# Patient Record
Sex: Male | Born: 1980 | Race: Black or African American | Hispanic: No | Marital: Single | Smoking: Current every day smoker
Health system: Southern US, Community
[De-identification: ages and names within clinical notes are randomized; demographics above are authoritative.]

## PROBLEM LIST (undated history)

## (undated) ENCOUNTER — Ambulatory Visit (HOSPITAL_COMMUNITY): Admission: EM | Payer: Self-pay | Source: Home / Self Care

## (undated) DIAGNOSIS — N200 Calculus of kidney: Secondary | ICD-10-CM

## (undated) DIAGNOSIS — F10121 Alcohol abuse with intoxication delirium: Secondary | ICD-10-CM

## (undated) HISTORY — PX: HERNIA REPAIR: SHX51

---

## 2005-06-08 ENCOUNTER — Emergency Department (HOSPITAL_COMMUNITY): Admission: EM | Admit: 2005-06-08 | Discharge: 2005-06-08 | Payer: Self-pay | Admitting: Emergency Medicine

## 2007-03-08 ENCOUNTER — Emergency Department (HOSPITAL_COMMUNITY): Admission: EM | Admit: 2007-03-08 | Discharge: 2007-03-08 | Payer: Self-pay | Admitting: Emergency Medicine

## 2009-06-18 ENCOUNTER — Emergency Department (HOSPITAL_COMMUNITY): Admission: EM | Admit: 2009-06-18 | Discharge: 2009-06-18 | Payer: Self-pay | Admitting: Emergency Medicine

## 2009-06-29 ENCOUNTER — Emergency Department (HOSPITAL_COMMUNITY): Admission: EM | Admit: 2009-06-29 | Discharge: 2009-06-29 | Payer: Self-pay | Admitting: Emergency Medicine

## 2011-03-10 ENCOUNTER — Emergency Department (HOSPITAL_COMMUNITY)
Admission: EM | Admit: 2011-03-10 | Discharge: 2011-03-10 | Disposition: A | Discharge status: Home / Self Care | Payer: Self-pay | Attending: Emergency Medicine | Admitting: Emergency Medicine

## 2011-03-10 DIAGNOSIS — J039 Acute tonsillitis, unspecified: Secondary | ICD-10-CM | POA: Insufficient documentation

## 2011-03-10 LAB — RAPID STREP SCREEN (MED CTR MEBANE ONLY): Streptococcus, Group A Screen (Direct): NEGATIVE

## 2011-04-26 ENCOUNTER — Emergency Department (HOSPITAL_COMMUNITY)
Admission: EM | Admit: 2011-04-26 | Discharge: 2011-04-26 | Disposition: A | Discharge status: Home / Self Care | Payer: Self-pay | Attending: Emergency Medicine | Admitting: Emergency Medicine

## 2013-05-27 ENCOUNTER — Emergency Department (HOSPITAL_COMMUNITY)
Admission: EM | Admit: 2013-05-27 | Discharge: 2013-05-27 | Disposition: A | Discharge status: Home / Self Care | Payer: Self-pay | Attending: Emergency Medicine | Admitting: Emergency Medicine

## 2013-05-27 ENCOUNTER — Encounter (HOSPITAL_COMMUNITY): Payer: Self-pay | Admitting: Emergency Medicine

## 2013-05-27 ENCOUNTER — Emergency Department (HOSPITAL_COMMUNITY): Payer: Self-pay

## 2013-05-27 DIAGNOSIS — M25512 Pain in left shoulder: Secondary | ICD-10-CM

## 2013-05-27 DIAGNOSIS — M25519 Pain in unspecified shoulder: Secondary | ICD-10-CM | POA: Insufficient documentation

## 2013-05-27 DIAGNOSIS — F17200 Nicotine dependence, unspecified, uncomplicated: Secondary | ICD-10-CM | POA: Insufficient documentation

## 2013-05-27 MED ORDER — TRAMADOL HCL 50 MG PO TABS: 50.0000 mg | ORAL_TABLET | Freq: Four times a day (QID) | ORAL | Status: DC | PRN

## 2013-05-27 NOTE — ED Provider Notes (Signed)
CSN: [id number]     Arrival date & time 05/27/13  0809 History  This chart was scribed for non-physician practitioner Roger Sites, PA-C, working with Bonnita Levan. Bernette Mayers, MD by Dorothey Baseman, ED Scribe. This patient was seen in room TR06C/TR06C and the patient's care was started at 9:23 AM.    Chief Complaint  Patient presents with  . Shoulder Pain   The history is provided by the patient. No language interpreter was used.   HPI Comments: Roger Wright is a 32 y.o. male who presents to the Emergency Department complaining of an intermittent, aching, pain to the left shoulder pain secondary to a fall that occurred 1 week ago. No head trauma or LOC.  He reports that the pain is exacerbated with backwards movement of the left shoulder, no pain with movement in other directions. Patient reports associated mild swelling to the area and a decreased range of motion. He denies history of prior injury to the area.  Denies any numbness or paresthesias of extremity.  History reviewed. No pertinent past medical history. History reviewed. No pertinent past surgical history. No family history on file. History  Substance Use Topics  . Smoking status: Current Every Day Smoker  . Smokeless tobacco: Not on file  . Alcohol Use: Yes    Review of Systems  Musculoskeletal: Positive for arthralgias.  All other systems reviewed and are negative.    Allergies  Review of patient's allergies indicates no known allergies.  Home Medications   Current Outpatient Rx  Name  Route  Sig  Dispense  Refill  . HYDROcodone-acetaminophen (NORCO/VICODIN) 5-325 MG per tablet   Oral   Take 1 tablet by mouth every 6 (six) hours as needed for pain.         . hydrocortisone cream 1 %   Topical   Apply 1 application topically daily as needed (Nose red spot).         Marland Kitchen ibuprofen (ADVIL,MOTRIN) 200 MG tablet   Oral   Take 800 mg by mouth every 6 (six) hours as needed for pain.          Triage Vitals: BP 155/88   Pulse 61  Temp(Src) 98.1 F (36.7 C) (Oral)  Resp 20  Ht 5\' 9"  (1.753 m)  Wt 175 lb (79.379 kg)  BMI 25.83 kg/m2  SpO2 99%  Physical Exam  Nursing note and vitals reviewed. Constitutional: He is oriented to person, place, and time. He appears well-developed and well-nourished.  HENT:  Head: Normocephalic and atraumatic.  Eyes: Conjunctivae and EOM are normal.  Neck: Normal range of motion.  Cardiovascular: Normal rate, regular rhythm and normal heart sounds.   Pulmonary/Chest: Effort normal and breath sounds normal. No respiratory distress.  Abdominal: Soft. He exhibits no distension.  Musculoskeletal: Normal range of motion.       Left shoulder: He exhibits tenderness, bony tenderness, swelling and pain. He exhibits normal range of motion, no effusion, no crepitus, no deformity, no laceration, no spasm, normal pulse and normal strength.  Tenderness to palpation to the left [facility] joint with mild swelling; full ROM maintained, some pain with hyperextension of the left shoulder; strong distal pulse and cap refill; sensation intact  Neurological: He is alert and oriented to person, place, and time.  Skin: Skin is warm and dry.  Psychiatric: He has a normal mood and affect.    ED Course  Procedures (including critical care time)  DIAGNOSTIC STUDIES: Oxygen Saturation is 99% on room air, normal by my interpretation.  COORDINATION OF CARE: 9:24AM- Will order an x-ray of the left shoulder. Discussed treatment plan with patient at bedside and patient verbalized agreement.   10:25 AM- Discussed negative x-ray results. Advised patient to avoid strenuous activity until symptoms subside. Will manage pain symptoms with medications. Discussed treatment plan with patient at bedside and patient verbalized agreement.   Labs Review Labs Reviewed - No data to display  Imaging Review Dg Shoulder Left  05/27/2013   CLINICAL DATA:  Pain in the left shoulder.  EXAM: LEFT SHOULDER - 2+ VIEW   COMPARISON:  No priors.  FINDINGS: Multiple views of the left shoulder demonstrate no acute displaced fracture, subluxation, dislocation, or soft tissue abnormality.  IMPRESSION: No acute radiographic abnormality of the left shoulder.   Electronically Signed   By: Trudie Reed M.D.   On: 05/27/2013 10:12    EKG Interpretation   None       MDM   1. Shoulder pain, left    X-ray negative for acute fracture or dislocation.  Patient will be discharged with tramadol. Instructed to limited strenuous shoulder activity, including weight lifting, until sx improve.  [facility]. Discussed plan with patient, he agreed. Return precautions advised.  I personally performed the services described in this documentation, which was scribed in my presence. The recorded information has been reviewed and is accurate.  Garlon Hatchet, PA-C 05/27/13 1044

## 2013-05-27 NOTE — ED Notes (Signed)
Pt. Fell 1 week ago straight back and since has been having pain to the top of his lt. shoulder area.  Mild swelling noted.  Pt. Does have full ROM.   Pain is intermittent.

## 2013-05-29 NOTE — ED Provider Notes (Signed)
Medical screening examination/treatment/procedure(s) were performed by non-physician practitioner and as supervising physician I was immediately available for consultation/collaboration.   Charles B. Sheldon, MD 05/29/13 1224 

## 2013-12-26 ENCOUNTER — Emergency Department (HOSPITAL_COMMUNITY): Payer: Self-pay

## 2013-12-26 ENCOUNTER — Encounter (HOSPITAL_COMMUNITY): Payer: Self-pay | Admitting: Emergency Medicine

## 2013-12-26 ENCOUNTER — Emergency Department (HOSPITAL_COMMUNITY)
Admission: EM | Admit: 2013-12-26 | Discharge: 2013-12-26 | Disposition: A | Discharge status: Home / Self Care | Payer: Self-pay | Attending: Emergency Medicine | Admitting: Emergency Medicine

## 2013-12-26 DIAGNOSIS — K6289 Other specified diseases of anus and rectum: Secondary | ICD-10-CM | POA: Insufficient documentation

## 2013-12-26 DIAGNOSIS — F17200 Nicotine dependence, unspecified, uncomplicated: Secondary | ICD-10-CM | POA: Insufficient documentation

## 2013-12-26 DIAGNOSIS — IMO0002 Reserved for concepts with insufficient information to code with codable children: Secondary | ICD-10-CM | POA: Insufficient documentation

## 2013-12-26 LAB — CBC WITH DIFFERENTIAL/PLATELET
Basophils Absolute: 0 10*3/uL (ref 0.0–0.1)
Basophils Relative: 1 % (ref 0–1)
EOS PCT: 5 % (ref 0–5)
Eosinophils Absolute: 0.3 10*3/uL (ref 0.0–0.7)
HCT: 40 % (ref 39.0–52.0)
Hemoglobin: 13.8 g/dL (ref 13.0–17.0)
LYMPHS PCT: 27 % (ref 12–46)
Lymphs Abs: 1.8 10*3/uL (ref 0.7–4.0)
MCH: 30 pg (ref 26.0–34.0)
MCHC: 34.5 g/dL (ref 30.0–36.0)
MCV: 87 fL (ref 78.0–100.0)
Monocytes Absolute: 0.6 10*3/uL (ref 0.1–1.0)
Monocytes Relative: 9 % (ref 3–12)
NEUTROS PCT: 59 % (ref 43–77)
Neutro Abs: 3.8 10*3/uL (ref 1.7–7.7)
PLATELETS: 208 10*3/uL (ref 150–400)
RBC: 4.6 MIL/uL (ref 4.22–5.81)
RDW: 13.4 % (ref 11.5–15.5)
WBC: 6.4 10*3/uL (ref 4.0–10.5)

## 2013-12-26 LAB — BASIC METABOLIC PANEL
BUN: 7 mg/dL (ref 6–23)
CO2: 26 mEq/L (ref 19–32)
CREATININE: 1.21 mg/dL (ref 0.50–1.35)
Calcium: 9.2 mg/dL (ref 8.4–10.5)
Chloride: 101 mEq/L (ref 96–112)
GFR, EST NON AFRICAN AMERICAN: 78 mL/min — AB (ref >90)
GLUCOSE: 109 mg/dL — AB (ref 70–99)
Potassium: 3.8 mEq/L (ref 3.7–5.3)
Sodium: 139 mEq/L (ref 137–147)

## 2013-12-26 MED ORDER — LIDOCAINE HCL 1 % IJ SOLN
INTRAMUSCULAR | Status: AC
  Administered 2013-12-26: 1.2 mL
  Filled 2013-12-26: qty 20

## 2013-12-26 MED ORDER — CEFTRIAXONE SODIUM 250 MG IJ SOLR
250.0000 mg | Freq: Once | INTRAMUSCULAR | Status: AC
  Administered 2013-12-26: 250 mg via INTRAMUSCULAR
  Filled 2013-12-26: qty 250

## 2013-12-26 MED ORDER — DOXYCYCLINE HYCLATE 100 MG PO CAPS: 100.0000 mg | ORAL_CAPSULE | Freq: Two times a day (BID) | ORAL | Status: DC

## 2013-12-26 NOTE — Discharge Instructions (Signed)
Proctitis °Proctitis is the swelling and soreness (inflammation) of the lining of the rectum. The rectum is at the end of the large intestine and is attached to the anus. The inflammation causes pain and discomfort. It may be short-term (acute) or long-lasting (chronic). °CAUSES °Inflammation in the rectum can be caused by many things, such as: °· Sexually transmitted diseases (STDs). °· Infection. °· Anal-rectal trauma or injury. °· Ulcerative colitis or Crohn's disease. °· Radiation therapy directed near the rectum. °· Antibiotic therapy. °SYMPTOMS °· Sudden, uncomfortable, and frequent urge to have a bowel movement. °· Anal or rectal pain. °· Abdominal cramping or pain. °· Sensation that the rectum is full. °· Rectal bleeding. °· Pus or mucus discharge from anus. °· Diarrhea or frequent soft, loose stools. °DIAGNOSIS °Diagnosis may include the following: °· A history and physical exam. °· An STD test. °· Blood tests. °· Stool tests. °· Rectal culture. °· A procedure to evaluate the anal canal (anoscopy). °· Procedures to look at part, or the entire large bowel (sigmoidoscopy, colonoscopy). °TREATMENT °Treatment of proctitis depends on the cause. Reducing the symptoms of inflammation and eliminating infection are the main goals of treatment. Treatment may include: °· Home remedies and lifestyle, such as sitz baths and avoiding food right before bedtime. °· Topical ointments, foams, suppositories, or enemas, such as corticosteroids or anti-inflammatories. °· Antibiotic or antiviral medicines to treat infection or to control harmful bacteria. °· Medicines to control diarrhea, soften stools, and reduce pain. °· Medicines to suppress the immune system. °· Avoiding the activity that caused rectal trauma. °· Nutritional, dietary, or herbal supplements. °· Heat or laser therapy for persistent bleeding. °· A dilation procedure to enlarge a narrowed rectum. °· Surgery, though rare, may be necessary to repair damaged rectal  lining. °HOME CARE INSTRUCTIONS °Only take medicines that are recommended or approved by your caregiver. Do not take anti-diarrhea medicine without your caregiver's approval. °SEEK MEDICAL CARE IF: °· You often experience one or more of the symptoms noted above. °· You keep experiencing symptoms after treatment. °· You have questions or concerns about your symptoms or treatment plan. °MAKE SURE YOU: °· Understand these instructions. °· Will watch your condition. °· Will get help right away if you are not doing well or get worse. °FOR MORE INFORMATION °National Institute of Diabetes and Digestive and Kidney Disease (NIDDK): www.digestive.niddk.hih.gov/ °Document Released: 07/22/2011 Document Revised: 11/27/2012 Document Reviewed: 07/22/2011 °ExitCare® Patient Information ©2014 [employer]. ° °

## 2013-12-26 NOTE — ED Notes (Signed)
Pt states he's constipated, states last normal BM was Sunday, states since then has had small BM's but does not equal to amount of food he's eaten, states has taken laxatives and drank water w/ no relief, states having lower back pain from constipation. Denies urinary symptoms.

## 2013-12-26 NOTE — ED Provider Notes (Signed)
CSN: [id number]     Arrival date & time 12/26/13  2005 History   First MD Initiated Contact with Patient 12/26/13 2029     Chief Complaint  Patient presents with  . Constipation    HPI Comments: Pt has the sensation he has to have a bowel movement but just is having a paste discharge.  Patient is a 33 y.o. male presenting with constipation.  Constipation Severity:  Moderate Time since last bowel movement:  3 days Timing:  Constant Progression:  Worsening Chronicity:  New Context: not dehydration and not dietary changes   Stool description:  Small Relieved by:  Nothing Ineffective treatments:  Laxatives Associated symptoms: no abdominal pain, no anorexia, no fever, no nausea and no vomiting   Pt states he has had anal intercourse.  None in months.  History reviewed. No pertinent past medical history. Past Surgical History  Procedure Laterality Date  . Hernia repair     No family history on file. History  Substance Use Topics  . Smoking status: Current Every Day Smoker  . Smokeless tobacco: Never Used  . Alcohol Use: Yes    Review of Systems  Constitutional: Negative for fever.  Gastrointestinal: Positive for constipation. Negative for nausea, vomiting, abdominal pain and anorexia.  All other systems reviewed and are negative.     Allergies  Review of patient's allergies indicates no known allergies.  Home Medications   Prior to Admission medications   Medication Sig Start Date End Date Taking? Authorizing Provider  hydrocortisone cream 1 % Apply 1 application topically daily as needed (Nose red spot).    Historical Provider, MD  ibuprofen (ADVIL,MOTRIN) 200 MG tablet Take 800 mg by mouth every 6 (six) hours as needed for pain.    Historical Provider, MD   BP 160/85  Pulse 76  Temp(Src) 99.8 F (37.7 C) (Oral)  Resp 20  SpO2 99% Physical Exam  Nursing note and vitals reviewed. Constitutional: He appears well-developed and well-nourished. No distress.   HENT:  Head: Normocephalic and atraumatic.  Right Ear: External ear normal.  Left Ear: External ear normal.  Eyes: Conjunctivae are normal. Right eye exhibits no discharge. Left eye exhibits no discharge. No scleral icterus.  Neck: Neck supple. No tracheal deviation present.  Cardiovascular: Normal rate, regular rhythm and intact distal pulses.   Pulmonary/Chest: Effort normal and breath sounds normal. No stridor. No respiratory distress. He has no wheezes. He has no rales.  Abdominal: Soft. Bowel sounds are normal. He exhibits no distension. There is no tenderness. There is no rebound and no guarding.  Genitourinary: Rectum normal.  No fecal impaction, brown stool  Musculoskeletal: He exhibits no edema and no tenderness.  Neurological: He is alert. He has normal strength. No cranial nerve deficit (no facial droop, extraocular movements intact, no slurred speech) or sensory deficit. He exhibits normal muscle tone. He displays no seizure activity. Coordination normal.  Skin: Skin is warm and dry. No rash noted.  Psychiatric: He has a normal mood and affect.    ED Course  Procedures (including critical care time) Labs Review Labs Reviewed  BASIC METABOLIC PANEL - Abnormal; Notable for the following:    Glucose, Bld 109 (*)    GFR calc non Af Amer 78 (*)    All other components within normal limits  CBC WITH DIFFERENTIAL    Imaging Review Dg Abd Acute W/chest  12/26/2013   CLINICAL DATA:  Constipation abdominal pain  EXAM: ACUTE ABDOMEN SERIES (ABDOMEN 2 VIEW & CHEST 1  VIEW)  COMPARISON:  None.  FINDINGS: There is no evidence of dilated bowel loops or free intraperitoneal air. No radiopaque calculi or other significant radiographic abnormality is seen. Heart size and mediastinal contours are within normal limits. Both lungs are clear.  IMPRESSION: Negative abdominal radiographs.  No acute cardiopulmonary disease.   Electronically Signed   By: Alcide CleverMark  Lukens M.D.   On: 12/26/2013 21:15      MDM   Final diagnoses:  Proctitis   Labs and xray are normal.  Symptoms are suggestive of a proctitis.  He denies recent intercourse but will try a course of abx.  Follow up with PCP  Celene KrasJon R Riniyah Speich, MD 12/27/13 [phone]

## 2013-12-26 NOTE — ED Notes (Signed)
MD at bedside. 

## 2014-06-11 ENCOUNTER — Emergency Department (HOSPITAL_COMMUNITY)
Admission: EM | Admit: 2014-06-11 | Discharge: 2014-06-11 | Disposition: A | Discharge status: Home / Self Care | Payer: Self-pay | Attending: Emergency Medicine | Admitting: Emergency Medicine

## 2014-06-11 ENCOUNTER — Encounter (HOSPITAL_COMMUNITY): Payer: Self-pay | Admitting: Emergency Medicine

## 2014-06-11 DIAGNOSIS — J029 Acute pharyngitis, unspecified: Secondary | ICD-10-CM | POA: Insufficient documentation

## 2014-06-11 DIAGNOSIS — Z792 Long term (current) use of antibiotics: Secondary | ICD-10-CM | POA: Insufficient documentation

## 2014-06-11 DIAGNOSIS — Z720 Tobacco use: Secondary | ICD-10-CM | POA: Insufficient documentation

## 2014-06-11 LAB — RAPID STREP SCREEN (MED CTR MEBANE ONLY): Streptococcus, Group A Screen (Direct): NEGATIVE

## 2014-06-11 MED ORDER — KETOROLAC TROMETHAMINE 60 MG/2ML IM SOLN
60.0000 mg | Freq: Once | INTRAMUSCULAR | Status: AC
  Administered 2014-06-11: 60 mg via INTRAMUSCULAR
  Filled 2014-06-11: qty 2

## 2014-06-11 MED ORDER — DEXAMETHASONE SODIUM PHOSPHATE 10 MG/ML IJ SOLN
10.0000 mg | Freq: Once | INTRAMUSCULAR | Status: AC
  Administered 2014-06-11: 10 mg via INTRAMUSCULAR
  Filled 2014-06-11: qty 1

## 2014-06-11 MED ORDER — HYDROCODONE-ACETAMINOPHEN 5-325 MG PO TABS: 1.0000 | ORAL_TABLET | ORAL | Status: DC | PRN

## 2014-06-11 NOTE — ED Notes (Signed)
Pt reporting sore throat x2 days. Was around cousin this past weekend who was diagnosed with strep throat. Denies N, V, D, F. C/o pain on left side of face radiating across cheek. Assumed it was dental related. Took Goody powder and Tylenol today with no alleviation of symptoms. No other questions/concerns.

## 2014-06-11 NOTE — Discharge Instructions (Signed)
Read instructions below to learn more about your diagnosis and for reasons to return to the ED. Followup with your doctor if symptoms are not improving after 3-4 days.  If you do not have a doctor use the resource guide listed below to help he find one. You may return to the emergency department if symptoms worsen, become progressive, or become more concerning. Use Naphazoline 1 drop every 2 hours as needed for itching. Do NOT use this medication more then 5 days!!  Viral Pharyngitis  Viral pharyngitis is a viral infection that produces redness, pain, and swelling (inflammation) of the throat. Viral infections are not treated with antibiotics.  HOME CARE INSTRUCTIONS  Drink enough fluids to keep your urine clear or pale yellow.  Eat soft, cold foods such as ice cream, frozen ice pops, or gelatin dessert.  Gargle with warm salt water (1 tsp salt per 1 qt of water).  Throat lozenges  Use over the counter medications for symptomatic relief  (Tylenol for fever/pain, Motrin/Ibuprofen for swelling/pain)  To help prevent spreading viral pharyngitis to others, avoid:  Mouth-to-mouth contact with others.  Sharing utensils for eating and drinking.  Coughing around others.   SEEK IMMEDIATE MEDICAL CARE IF:  A rash develops.  Bloody substance is coughed up.  You are unable to swallow liquids or food for 24 hours.  You develop any new symptoms such as uncontrolable vomiting, severe headache, stiff neck, chest pain, or trouble breathing or swallowing.  You have severe throat pain along with drooling or voice changes.   You are unable to fully open the mouth.   RESOURCE GUIDE  Dental Problems  Patients with Medicaid: [facility]                     [facility] [phone]                                           [phone] Phone:  [phone]                                                  Phone:  [phone]  If unable to pay or uninsured, contact:  [facility]  or [facility] to become qualified for the adult dental clinic.  Chronic Pain Problems Contact [facility]  [phone] Patients need to be referred by their primary care doctor.  Insufficient Money for Medicine Contact [employer]:  call "211" or [facility] [phone].  No Primary Care Doctor Call [facility]  [phone] Other agencies that provide inexpensive medical care    [facility]  [phone]    [facility]  [phone]    [facility]  [phone]    [facility]  [phone]    [facility]  [phone]    [facility]  [phone]  Psychological Services [facility]  [phone] [facility]  [phone] [facility]   [phone] (emergency services [phone])  Substance Abuse Resources [facility]  [phone] [facility] [phone] [facility] [phone] [facility] [phone] Residential & Outpatient Substance Abuse Program  [phone]  Abuse/Neglect [facility]  Hotline 3073771946(336) [phone])[phone] [facility] Hotline (865) [phone] (After Hours)  Emergency Shelter [facility] [phone]  M[facility] [place] [place] [phone]) [phone] [facility] [phone]  MRSA Hotline #:   8653879631(316)088-4456    [facility]  [facility]     [employer]                          [facility] [address] [place]                       [address]      [address]  [place]                                                [place]                            [place] Phone:  [phone]                                   Phone:  [phone]                 Phone:  [phone]  [facility] Phone:  [phone]  [facility] Child Abuse Hotline 209-334-0941(336) (914)786-4530 [phone])[phone](336)  [phone] (After Hours)

## 2014-06-11 NOTE — ED Provider Notes (Signed)
CSN: [id number]     Arrival date & time 06/11/14  1739 History  This chart was scribed for non-physician practitioner Arthor CaptainAbigail Treina Arscott, PA-C working with Arby BarretteMarcy Pfeiffer, MD by Littie Deedsichard Sun, ED Scribe. This patient was seen in room WTR7/WTR7 and the patient's care was started at 7:46 PM.       Chief Complaint  Patient presents with  . Sore Throat     The history is provided by the patient. No language interpreter was used.   HPI Comments: Roger Wright is a 33 y.o. male who presents to the Emergency Department complaining of gradual onset, gradually worsening, constant sore throat that began 2 days ago. Patient states that the pain is mostly in the left side of his throat. He states the pain is worse during the day after work. He has not tried anything for his symptoms. Patient denies cough, fever, chills, congestion, SOB. 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 Patient is a smoker. He is not aware of any dental problems.   History reviewed. No pertinent past medical history. Past Surgical History  Procedure Laterality Date  . Hernia repair     History reviewed. No pertinent family history. History  Substance Use Topics  . Smoking status: Current Every Day Smoker  . Smokeless tobacco: Never Used  . Alcohol Use: Yes    Review of Systems  Constitutional: Negative for fever and chills.  HENT: Positive for sore throat. Negative for congestion.   Respiratory: Negative for cough and shortness of breath.   Gastrointestinal: Negative for nausea, vomiting and diarrhea.      Allergies  Review of patient's allergies indicates no known allergies.  Home Medications   Prior to Admission medications   Medication Sig Start Date End Date Taking? Authorizing Provider  bismuth subsalicylate (PEPTO BISMOL) 262 MG/15ML suspension Take 30 mLs by mouth as needed for indigestion.    Historical Provider, MD  docusate sodium (COLACE) 100 MG capsule Take 300-400 mg by mouth 3 (three)  times daily as needed for moderate constipation.    Historical Provider, MD  doxycycline (VIBRAMYCIN) 100 MG capsule Take 1 capsule (100 mg total) by mouth 2 (two) times daily. 12/26/13   Linwood DibblesJon Knapp, MD  HYDROcodone-acetaminophen (NORCO) 5-325 MG per tablet Take 1 tablet by mouth every 4 (four) hours as needed. 06/11/14   Arthor CaptainAbigail Sabria Florido, PA-C  ibuprofen (ADVIL,MOTRIN) 200 MG tablet Take 400-600 mg by mouth every 6 (six) hours as needed for pain.     Historical Provider, MD   BP 144/93 mmHg  Pulse 64  Temp(Src) 99 F (37.2 C) (Oral)  Resp 16  SpO2 100% Physical Exam  Nursing note and vitals reviewed. Constitutional: He is oriented to person, place, and time. He appears well-developed and well-nourished. No distress.  HENT:  Head: Normocephalic and atraumatic.  Mouth/Throat: Oropharynx is clear and moist. No oropharyngeal exudate.  Uvula deviated to left. Swelling of uvula. Throat erythematous. No exudates. Tonsils look normal.  Eyes: Pupils are equal, round, and reactive to light.  Neck: Neck supple.  Cardiovascular: Normal rate.   Pulmonary/Chest: Effort normal.  Musculoskeletal: He exhibits no edema.  Neurological: He is alert and oriented to person, place, and time. No cranial nerve deficit.  Skin: Skin is warm and dry. No rash noted.  Psychiatric: He has a normal mood and affect. His behavior is normal.    ED Course  Procedures  DIAGNOSTIC STUDIES: Oxygen Saturation is 100% on room air, normal by my interpretation.  COORDINATION OF CARE: 7:46 PM-Discussed treatment plan which includes medication with pt at bedside and pt agreed to plan.    Labs Review Labs Reviewed  RAPID STREP SCREEN  CULTURE, GROUP A STREP    Imaging Review No results found.   EKG Interpretation None      MDM   Final diagnoses:  Pharyngitis   Pt afebrile without tonsillar exudate, negative strep. Presents with mild cervical lymphadenopathy, & dysphagia; diagnosis of viral pharyngitis. No  abx indicated. DC w symptomatic tx for pain  Pt does not appear dehydrated, but did discuss importance of water rehydration. Presentation non concerning for PTA or infxn spread to soft tissue. No trismus or uvula deviation. Specific return precautions discussed. Pt able to drink water in ED without difficulty with intact air way. Recommended PCP follow up.   I personally performed the services described in this documentation, which was scribed in my presence. The recorded information has been reviewed and is accurate.    Arthor CaptainAbigail Karstyn Birkey, PA-C 06/16/14 1955

## 2014-06-13 LAB — CULTURE, GROUP A STREP

## 2014-12-30 ENCOUNTER — Emergency Department (HOSPITAL_COMMUNITY): Payer: Self-pay

## 2014-12-30 ENCOUNTER — Encounter (HOSPITAL_COMMUNITY): Payer: Self-pay | Admitting: Emergency Medicine

## 2014-12-30 ENCOUNTER — Inpatient Hospital Stay (HOSPITAL_COMMUNITY)
Admission: EM | Admit: 2014-12-30 | Discharge: 2014-12-31 | DRG: 443 | Disposition: A | Discharge status: Home / Self Care | Payer: Self-pay | Attending: Internal Medicine | Admitting: Internal Medicine

## 2014-12-30 DIAGNOSIS — F17200 Nicotine dependence, unspecified, uncomplicated: Secondary | ICD-10-CM

## 2014-12-30 DIAGNOSIS — Y908 Blood alcohol level of 240 mg/100 ml or more: Secondary | ICD-10-CM | POA: Diagnosis present

## 2014-12-30 DIAGNOSIS — Z79899 Other long term (current) drug therapy: Secondary | ICD-10-CM

## 2014-12-30 DIAGNOSIS — R9431 Abnormal electrocardiogram [ECG] [EKG]: Secondary | ICD-10-CM

## 2014-12-30 DIAGNOSIS — B169 Acute hepatitis B without delta-agent and without hepatic coma: Principal | ICD-10-CM | POA: Diagnosis present

## 2014-12-30 DIAGNOSIS — X58XXXA Exposure to other specified factors, initial encounter: Secondary | ICD-10-CM

## 2014-12-30 DIAGNOSIS — R7401 Elevation of levels of liver transaminase levels: Secondary | ICD-10-CM | POA: Diagnosis present

## 2014-12-30 DIAGNOSIS — F10929 Alcohol use, unspecified with intoxication, unspecified: Secondary | ICD-10-CM | POA: Insufficient documentation

## 2014-12-30 DIAGNOSIS — B179 Acute viral hepatitis, unspecified: Secondary | ICD-10-CM | POA: Diagnosis present

## 2014-12-30 DIAGNOSIS — T148 Other injury of unspecified body region: Secondary | ICD-10-CM

## 2014-12-30 DIAGNOSIS — F17210 Nicotine dependence, cigarettes, uncomplicated: Secondary | ICD-10-CM | POA: Diagnosis present

## 2014-12-30 DIAGNOSIS — R740 Nonspecific elevation of levels of transaminase and lactic acid dehydrogenase [LDH]: Secondary | ICD-10-CM | POA: Diagnosis present

## 2014-12-30 DIAGNOSIS — Z79891 Long term (current) use of opiate analgesic: Secondary | ICD-10-CM

## 2014-12-30 DIAGNOSIS — F10121 Alcohol abuse with intoxication delirium: Secondary | ICD-10-CM

## 2014-12-30 DIAGNOSIS — F10129 Alcohol abuse with intoxication, unspecified: Secondary | ICD-10-CM | POA: Diagnosis present

## 2014-12-30 HISTORY — DX: Alcohol abuse with intoxication delirium: F10.121

## 2014-12-30 LAB — CBC WITH DIFFERENTIAL/PLATELET
BASOS ABS: 0 10*3/uL (ref 0.0–0.1)
BASOS PCT: 1 % (ref 0–1)
Eosinophils Absolute: 0.1 10*3/uL (ref 0.0–0.7)
Eosinophils Relative: 1 % (ref 0–5)
HEMATOCRIT: 45.6 % (ref 39.0–52.0)
HEMOGLOBIN: 15.1 g/dL (ref 13.0–17.0)
LYMPHS PCT: 40 % (ref 12–46)
Lymphs Abs: 2.3 10*3/uL (ref 0.7–4.0)
MCH: 29.9 pg (ref 26.0–34.0)
MCHC: 33.1 g/dL (ref 30.0–36.0)
MCV: 90.3 fL (ref 78.0–100.0)
Monocytes Absolute: 0.5 10*3/uL (ref 0.1–1.0)
Monocytes Relative: 8 % (ref 3–12)
NEUTROS ABS: 2.9 10*3/uL (ref 1.7–7.7)
Neutrophils Relative %: 50 % (ref 43–77)
Platelets: 190 10*3/uL (ref 150–400)
RBC: 5.05 MIL/uL (ref 4.22–5.81)
RDW: 14.6 % (ref 11.5–15.5)
WBC: 5.7 10*3/uL (ref 4.0–10.5)

## 2014-12-30 LAB — COMPREHENSIVE METABOLIC PANEL
ALT: 2391 U/L — AB (ref 17–63)
ANION GAP: 14 (ref 5–15)
AST: 1698 U/L — ABNORMAL HIGH (ref 15–41)
Albumin: 3.9 g/dL (ref 3.5–5.0)
Alkaline Phosphatase: 236 U/L — ABNORMAL HIGH (ref 38–126)
CALCIUM: 9.3 mg/dL (ref 8.9–10.3)
CHLORIDE: 108 mmol/L (ref 101–111)
CO2: 24 mmol/L (ref 22–32)
CREATININE: 1.24 mg/dL (ref 0.61–1.24)
Glucose, Bld: 93 mg/dL (ref 65–99)
POTASSIUM: 4.1 mmol/L (ref 3.5–5.1)
SODIUM: 146 mmol/L — AB (ref 135–145)
Total Bilirubin: 7.8 mg/dL — ABNORMAL HIGH (ref 0.3–1.2)
Total Protein: 7.5 g/dL (ref 6.5–8.1)

## 2014-12-30 LAB — URINALYSIS, ROUTINE W REFLEX MICROSCOPIC
GLUCOSE, UA: NEGATIVE mg/dL
Ketones, ur: NEGATIVE mg/dL
LEUKOCYTES UA: NEGATIVE
Nitrite: NEGATIVE
PH: 5.5 (ref 5.0–8.0)
Protein, ur: 30 mg/dL — AB
Specific Gravity, Urine: 1.008 (ref 1.005–1.030)
Urobilinogen, UA: 1 mg/dL (ref 0.0–1.0)

## 2014-12-30 LAB — RAPID URINE DRUG SCREEN, HOSP PERFORMED
AMPHETAMINES: NOT DETECTED
Barbiturates: NOT DETECTED
Benzodiazepines: POSITIVE — AB
Cocaine: NOT DETECTED
Opiates: NOT DETECTED
Tetrahydrocannabinol: POSITIVE — AB

## 2014-12-30 LAB — ETHANOL: Alcohol, Ethyl (B): 296 mg/dL — ABNORMAL HIGH (ref <5)

## 2014-12-30 LAB — IRON AND TIBC
Iron: 245 ug/dL — ABNORMAL HIGH (ref 45–182)
SATURATION RATIOS: 53 % — AB (ref 17.9–39.5)
TIBC: 465 ug/dL — AB (ref 250–450)
UIBC: 220 ug/dL

## 2014-12-30 LAB — BILIRUBIN, FRACTIONATED(TOT/DIR/INDIR)
BILIRUBIN INDIRECT: 3.1 mg/dL — AB (ref 0.3–0.9)
Bilirubin, Direct: 5 mg/dL — ABNORMAL HIGH (ref 0.1–0.5)
Total Bilirubin: 8.1 mg/dL — ABNORMAL HIGH (ref 0.3–1.2)

## 2014-12-30 LAB — ACETAMINOPHEN LEVEL

## 2014-12-30 LAB — GAMMA GT: GGT: 279 U/L — ABNORMAL HIGH (ref 7–50)

## 2014-12-30 LAB — URINE MICROSCOPIC-ADD ON

## 2014-12-30 LAB — PROTIME-INR
INR: 1.11 (ref 0.00–1.49)
PROTHROMBIN TIME: 14.4 s (ref 11.6–15.2)

## 2014-12-30 LAB — LIPASE, BLOOD: LIPASE: 35 U/L (ref 22–51)

## 2014-12-30 LAB — LACTATE DEHYDROGENASE: LDH: 919 U/L — AB (ref 98–192)

## 2014-12-30 LAB — CBG MONITORING, ED: Glucose-Capillary: 103 mg/dL — ABNORMAL HIGH (ref 65–99)

## 2014-12-30 LAB — SALICYLATE LEVEL: Salicylate Lvl: <4 mg/dL (ref 2.8–30.0)

## 2014-12-30 LAB — CK: Total CK: 311 U/L (ref 49–397)

## 2014-12-30 MED ORDER — ENOXAPARIN SODIUM 40 MG/0.4ML ~~LOC~~ SOLN
40.0000 mg | SUBCUTANEOUS | Status: DC
  Administered 2014-12-31: 40 mg via SUBCUTANEOUS
  Filled 2014-12-30 (×2): qty 0.4

## 2014-12-30 MED ORDER — SODIUM CHLORIDE 0.9 % IV BOLUS (SEPSIS)
1000.0000 mL | Freq: Once | INTRAVENOUS | Status: AC
  Administered 2014-12-30: 1000 mL via INTRAVENOUS

## 2014-12-30 MED ORDER — SODIUM CHLORIDE 0.9 % IV SOLN
INTRAVENOUS | Status: DC
  Administered 2014-12-30 – 2014-12-31 (×4): via INTRAVENOUS

## 2014-12-30 MED ORDER — NICOTINE 7 MG/24HR TD PT24
7.0000 mg | MEDICATED_PATCH | Freq: Every day | TRANSDERMAL | Status: DC
  Administered 2014-12-30 – 2014-12-31 (×2): 7 mg via TRANSDERMAL
  Filled 2014-12-30 (×2): qty 1

## 2014-12-30 MED ORDER — IBUPROFEN 200 MG PO TABS
400.0000 mg | ORAL_TABLET | Freq: Four times a day (QID) | ORAL | Status: DC | PRN
  Administered 2014-12-30 – 2014-12-31 (×2): 400 mg via ORAL
  Filled 2014-12-30 (×2): qty 2

## 2014-12-30 NOTE — ED Notes (Signed)
Admitting team at bedside.

## 2014-12-30 NOTE — H&P (Signed)
History and Physical  Date: 12/30/2014               Patient Name:  Roger Wright MRN: [id number]  DOB: [birth year] Age / Sex: 34 y.o., male   PCP: No Pcp Per Patient         Medical Service: Internal Medicine Teaching Service         Attending Physician: Dr. Madilyn Fireman, MD    First Contact: Reynaldo Minium Pager: [phone]  Second Contact: Dr. Ronnald Ramp Pager: [phone]       After Hours (After 5p/  First Contact Pager: [phone]  weekends / holidays): Second Contact Pager: [phone]   Chief Complaint: Abdominal Pain  History of Present Illness: Roger Wright is a 34 y.o. M with no significant PMH who was transferred via EMS after being found knocking on a stranger's door with lacs and abrasions. Per patient, he attended a food truck festival last night and went to a bar afterwards, and then believes he went home. He has no recollection of how he was injured. He reports drinking 4+ beers and ~15 oz of liquor that night. The patient reports feeling sick with a "churning" indigestion feeling about 3 weeks ago. He denies vomiting or diarrhea at that time. Subsequently, he reports deep LLQ abdominal pain consistent for the past 3 weeks that is intermittently described as stabbing or pressure. Approximately two weeks ago he reports noticing his urine looked darker, the color of a light beer. He denies dysuria, frequency, or urgency. He denies any red color to urine. Yesterday 5/15 he reports noticing that his sclerae were yellow. The patient takes multiple herbal/vitamin over-the-counter supplements. He stopped taking one about three weeks ago because it caused nausea. He has stopped working out for the past week due to muscle pain. For the last week he was fasting, eating one meal daily of blended kale and strawberries.  He takes 4 ibuprofen about 3 times weekly; he denies Tylenol or Aspirin use. He uses marijuana but denies any other recreational drug use. The patient is sexually active with men only and  uses protection. He has had gonorrhea but no other STIs that he knows of. He has been tested for HIV before and was negative. He has two tattoos, none within the past year. He denies sick contacts.  He denies nausea or vomiting at this time. He denies dysuria, frequency, urgency. He reports one bowel movement daily, normal color, no loose stools.  Meds: No current facility-administered medications for this encounter.   Current Outpatient Prescriptions  Medication Sig Dispense Refill  . GARLIC PO Take by mouth.    Marland Kitchen ibuprofen (ADVIL,MOTRIN) 200 MG tablet Take 400-600 mg by mouth every 6 (six) hours as needed for pain.     Marland Kitchen doxycycline (VIBRAMYCIN) 100 MG capsule Take 1 capsule (100 mg total) by mouth 2 (two) times daily. (Patient not taking: Reported on 12/30/2014) 20 capsule 0  . HYDROcodone-acetaminophen (NORCO) 5-325 MG per tablet Take 1 tablet by mouth every 4 (four) hours as needed. (Patient not taking: Reported on 12/30/2014) 6 tablet 0    Allergies: Allergies as of 12/30/2014  . (No Known Allergies)   History reviewed. No pertinent past medical history. Past Surgical History  Procedure Laterality Date  . Hernia repair     No family history on file. History   Social History  . Marital Status: Single    Spouse Name: N/A  . Number of Children: N/A  . Years of Education: N/A   Occupational  History  . Not on file.   Social History Main Topics  . Smoking status: Current Every Day Smoker  . Smokeless tobacco: Never Used  . Alcohol Use: Yes  . Drug Use: No  . Sexual Activity: Not on file   Other Topics Concern  . Not on file   Social History Narrative    Review of Systems: Constitutional: negative for fatigue, fevers and weight loss Eyes: positive for icterus Ears, nose, mouth, throat, and face: positive for facial trauma Respiratory: negative for cough, dyspnea on exertion and pleurisy/chest pain Cardiovascular: negative for chest pain and  fatigue Gastrointestinal: positive for abdominal pain and jaundice, negative for change in bowel habits, nausea and vomiting Genitourinary:positive for dark urine, negative for dysuria, frequency and hematuria Musculoskeletal:positive for myalgias Neurological: negative for coordination problems, dizziness and gait problems Behavioral/Psych: positive for excessive alcohol consumption Allergic/Immunologic: negative for urticaria  Physical Exam: Blood pressure 133/71, pulse 80, temperature 97.5 F (36.4 C), temperature source Oral, resp. rate 16, SpO2 99 %. BP 133/71 mmHg  Pulse 80  Temp(Src) 97.8 F (36.6 C) (Oral)  Resp 16  Ht 5' 9"  (1.753 m)  Wt 183 lb 6.4 oz (83.19 kg)  BMI 27.07 kg/m2  SpO2 99%  General Appearance:    Alert, calm, conversant African-American male sitting up in bed. Multiple bruises and lacerations on body  Head:    Several lacerations and areas of swelling visible over R brow and top of head  Eyes:    PERRL, sclerae icteric  Ears:    Laceration posterior to R ear  Throat:   Icterus under tongue. Lower lip swollen/lacerated  Neck:   Abrasion on R neck  Back:     No CVA tenderness  Lungs:     Clear to auscultation bilaterally, respirations unlabored  Heart:    Regular rate and rhythm, S1 and S2 normal, no murmur, rub   or gallop  Abdomen:     Soft, non-tender, +BS, no hepatosplenomegaly  Extremities:   No cyanosis, edema, or nail changes. Abrasions on R shoulder and L elbow.  Neurologic:   Alert and oriented to time, place, self. Moves all four limbs spontaneously.     Lab results: Basic Metabolic Panel:  Recent Labs  12/30/14 0741  NA 146*  K 4.1  CL 108  CO2 24  GLUCOSE 93  BUN <5*  CREATININE 1.24  CALCIUM 9.3   Liver Function Tests:  Recent Labs  12/30/14 0741  AST 1698*  ALT 2391*  ALKPHOS 236*  BILITOT 7.8*  PROT 7.5  ALBUMIN 3.9    Recent Labs  12/30/14 1019  LIPASE 35   No results for input(s): AMMONIA in the last 72  hours. CBC:  Recent Labs  12/30/14 1019  WBC 5.7  NEUTROABS 2.9  HGB 15.1  HCT 45.6  MCV 90.3  PLT 190   Cardiac Enzymes:  Recent Labs  12/30/14 1019  CKTOTAL 311   CBG:  Recent Labs  12/30/14 0703  GLUCAP 103*   Coagulation:  Recent Labs  12/30/14 1432  LABPROT 14.4  INR 1.11   Urine Drug Screen: Drugs of Abuse     Component Value Date/Time   LABOPIA NONE DETECTED 12/30/2014 0753   COCAINSCRNUR NONE DETECTED 12/30/2014 0753   LABBENZ POSITIVE* 12/30/2014 0753   AMPHETMU NONE DETECTED 12/30/2014 0753   THCU POSITIVE* 12/30/2014 0753   LABBARB NONE DETECTED 12/30/2014 0753    Alcohol Level:  Recent Labs  12/30/14 0741  ETH 296*  Urinalysis:  Recent Labs  12/30/14 0720  COLORURINE AMBER*  LABSPEC 1.008  PHURINE 5.5  GLUCOSEU NEGATIVE  HGBUR LARGE*  BILIRUBINUR SMALL*  KETONESUR NEGATIVE  PROTEINUR 30*  UROBILINOGEN 1.0  NITRITE NEGATIVE  LEUKOCYTESUR NEGATIVE   Imaging results:  Ct Head Wo Contrast  12/30/2014   CLINICAL DATA:  Altered mental status. Multifocal abrasions and bruises today. Initial encounter.  EXAM: CT HEAD WITHOUT CONTRAST  CT CERVICAL SPINE WITHOUT CONTRAST  TECHNIQUE: Multidetector CT imaging of the head and cervical spine was performed following the standard protocol without intravenous contrast. Multiplanar CT image reconstructions of the cervical spine were also generated.  COMPARISON:  None.  FINDINGS: CT HEAD FINDINGS  The brain appears normal without hemorrhage, infarct, mass lesion, mass effect, midline shift or abnormal extra-axial fluid collection. Soft tissue contusion above the right eye is noted. No underlying fracture or foreign body is identified. There also appears to be a scalp laceration on the left side of the head without underlying fracture. Imaged paranasal sinuses and mastoid air cells are clear.  CT CERVICAL SPINE FINDINGS  No fracture or malalignment of the cervical spine is identified. Shallow appearing  disc bulge and endplate spur are noted at C4-5.  IMPRESSION: Contusion above the right eye and likely scalp laceration on the left. Negative for fracture or acute intracranial abnormality.  No acute abnormality cervical spine. Shallow disc bulge and mild endplate spur W1-1 noted.   Electronically Signed   By: Inge Rise M.D.   On: 12/30/2014 08:52   Ct Cervical Spine Wo Contrast  12/30/2014   CLINICAL DATA:  Altered mental status. Multifocal abrasions and bruises today. Initial encounter.  EXAM: CT HEAD WITHOUT CONTRAST  CT CERVICAL SPINE WITHOUT CONTRAST  TECHNIQUE: Multidetector CT imaging of the head and cervical spine was performed following the standard protocol without intravenous contrast. Multiplanar CT image reconstructions of the cervical spine were also generated.  COMPARISON:  None.  FINDINGS: CT HEAD FINDINGS  The brain appears normal without hemorrhage, infarct, mass lesion, mass effect, midline shift or abnormal extra-axial fluid collection. Soft tissue contusion above the right eye is noted. No underlying fracture or foreign body is identified. There also appears to be a scalp laceration on the left side of the head without underlying fracture. Imaged paranasal sinuses and mastoid air cells are clear.  CT CERVICAL SPINE FINDINGS  No fracture or malalignment of the cervical spine is identified. Shallow appearing disc bulge and endplate spur are noted at C4-5.  IMPRESSION: Contusion above the right eye and likely scalp laceration on the left. Negative for fracture or acute intracranial abnormality.  No acute abnormality cervical spine. Shallow disc bulge and mild endplate spur B1-4 noted.   Electronically Signed   By: Inge Rise M.D.   On: 12/30/2014 08:52   US Abdomen Limited  12/30/2014   CLINICAL DATA:  Right upper quadrant pain. Elevated liver function tests.  EXAM: US ABDOMEN LIMITED - RIGHT UPPER QUADRANT  COMPARISON:  None.  FINDINGS: Gallbladder:  No gallstones or wall  thickening visualized. No sonographic Murphy sign noted.  Common bile duct:  Diameter: 0.3 cm  Liver:  No focal lesion identified. Within normal limits in parenchymal echogenicity.  IMPRESSION: Negative exam.   Electronically Signed   By: Inge Rise M.D.   On: 12/30/2014 13:19    Other results: EKG: there are no previous tracings available for comparison. Multiple artifacts. Wide-complex, diffuse ST elevation.  Assessment & Plan by Problem: Active Problems:   Transaminitis  35 y.o. M no PMH p/w 3 weeks of abdominal pain, 2 weeks of dark urine, 1 week of muscle pain, recent viral prodrome and transaminitis to 1000s.  Transaminitis: Possible viral prodrome followed by abdominal pain, dark urine, AST and ALT in 1000s c/f viral hepatitis B or C. Additionally, the pt has questionable ingestion hx including multiple herbal supplements. Ddx also includes autoimmune hepatitis, Wilson's disease, hemochromatosis. Elevated direct and indirect bili, alk phos. CK, salicylates, acetaminophen WNL. Abdominal U/S negative for biliary disease or parenchymal lesions. U/A with small bili, large Hgb. - GGT, hepatitis panel, anti-smooth muscle, ceruloplasmin, iron panel pending.  Trauma: Pt with multiple bruises/lacs/abrasions on head, shoulders, arms, does not remember etiology. Non-con CT head/neck negative for ICH or bleed. - Ibuprofen 400 q6hrs for pain - Avoid Tylenol for pain  Alcohol Abuse: Pt reports drinking 4+ beers and about 15 oz liquor on the night prior to admission. Reports drinking about 5 drinks per week for the past 6+ months. Denies h/o complicated withdrawal. - no CIWA. - Counseled on alcohol abuse  Tobacco Abuse: Pt smokes 2 packs a week, is requesting nicotine patch - Nicotine patch 7 mg  H/O STI: prev gonorrheal infxn, muscle/joint pains, presentation c/f hepatitis - GC/CT, HIV antibody  Abnormal EKG: ?motion artifacts. Diffusely wide-complex. ST elevation in all leads. Repeat  EKG.  F: NS @ 150 cc/hr E: BMP WNL. Replace prn. N: Regular diet  PPX: Lovenox for DVT PPX  Code Status: FULL  Dispo: Disposition is deferred at this time, awaiting improvement of current medical problems. Anticipated discharge in approximately 2-3 day(s).   The patient does have a current PCP (No Pcp Per Patient) and does need an [facility] hospital follow-up appointment after discharge.  The patient does not have transportation limitations that hinder transportation to clinic appointments.  Signed: Susa Day, Med Student 12/30/2014, 3:20 PM

## 2014-12-30 NOTE — Progress Notes (Signed)
[place] [phone]

## 2014-12-30 NOTE — Progress Notes (Signed)
Utilization review completed.  

## 2014-12-30 NOTE — ED Notes (Signed)
MD Goldston at bedside  

## 2014-12-30 NOTE — ED Notes (Signed)
States four rp five beers, states "low tolerance." Slurred speech. States urine is the "color of beer" concerned about dark urine.

## 2014-12-30 NOTE — Progress Notes (Signed)
Patient complains of not voiding well today.Patient bladder scanned for 36 cc of urine.Patient asked to void in urinal next time he uses bathroom so that we could keep and accurate measurement of urine output.Dr.Rothman aware.Will continue to monitor patient.

## 2014-12-30 NOTE — Progress Notes (Signed)
Patient reports he smokes about "2 packs a week of black and milds a week". Pt asking for a nicotine patch. MD made aware.

## 2014-12-30 NOTE — ED Provider Notes (Signed)
CSN: [id number]     Arrival date & time 12/30/14  [id number] History   First MD Initiated Contact with Patient 12/30/14 0700     Chief Complaint  Patient presents with  . Altered Mental Status     (Consider location/radiation/quality/duration/timing/severity/associated sxs/prior Treatment) HPI  34 year old male presents after EMS was called because the patient was knocking on doors in his apartment complex. The patient was near his home but in correctly knocking on wrong doors. Apparently he drank several beers and alcohol last night for a festival and then does not remember what happened last night. He presents with multiple abrasions and bruises, especially to his face and for head. The patient is complaining of pain over his right for head/eyebrow, otherwise has no acute complaints. When asked about neck pain he states he always has neck pain and this is not worse than normal but does not think he injured his neck. Patient denies any other pains except stating that he intermittently has testicle pain that has been ongoing for "a while". Last occurred a couple days ago it seemed to occur during sex. No current testicle pain. No urinary symptoms. Denies any illicit drug use. The rest of his history is difficult due to his intoxication.  History reviewed. No pertinent past medical history. Past Surgical History  Procedure Laterality Date  . Hernia repair     No family history on file. History  Substance Use Topics  . Smoking status: Current Every Day Smoker  . Smokeless tobacco: Never Used  . Alcohol Use: Yes    Review of Systems  Unable to perform ROS: Mental status change      Allergies  Review of patient's allergies indicates no known allergies.  Home Medications   Prior to Admission medications   Medication Sig Start Date End Date Taking? Authorizing Provider  bismuth subsalicylate (PEPTO BISMOL) 262 MG/15ML suspension Take 30 mLs by mouth as needed for indigestion.    Historical  Provider, MD  docusate sodium (COLACE) 100 MG capsule Take 300-400 mg by mouth 3 (three) times daily as needed for moderate constipation.    Historical Provider, MD  doxycycline (VIBRAMYCIN) 100 MG capsule Take 1 capsule (100 mg total) by mouth 2 (two) times daily. 12/26/13   Linwood DibblesJon Knapp, MD  HYDROcodone-acetaminophen (NORCO) 5-325 MG per tablet Take 1 tablet by mouth every 4 (four) hours as needed. 06/11/14   Arthor CaptainAbigail Harris, PA-C  ibuprofen (ADVIL,MOTRIN) 200 MG tablet Take 400-600 mg by mouth every 6 (six) hours as needed for pain.     Historical Provider, MD   BP 150/92 mmHg  Pulse 68  Resp 18  SpO2 100% Physical Exam  Constitutional: He appears well-developed and well-nourished.  HENT:  Head: Normocephalic.    Right Ear: External ear normal.  Left Ear: External ear normal.  Nose: Nose normal. No sinus tenderness, septal deviation or nasal septal hematoma.    Multiple bruises to forehead and scalp No significant tenderness over jaw line or maxilla  Eyes: EOM are normal. Pupils are equal, round, and reactive to light. Right eye exhibits no discharge. Left eye exhibits no discharge.  Neck: Normal range of motion. Neck supple. Muscular tenderness present. Normal range of motion present.  Cardiovascular: Normal rate, regular rhythm, normal heart sounds and intact distal pulses.   Pulmonary/Chest: Effort normal and breath sounds normal.  Abdominal: Soft. There is no tenderness.  Musculoskeletal: He exhibits no edema.       Right shoulder: He exhibits normal range of motion and  no tenderness.       Right forearm: He exhibits no tenderness and no bony tenderness.       Arms: Neurological: He is alert. He is disoriented. GCS eye subscore is 4. GCS verbal subscore is 4. GCS motor subscore is 6.  Confused on date, but otherwise he is alert and oriented. Normal strength in all 4 extremities  Skin: Skin is warm and dry.  Nursing note and vitals reviewed.   ED Course  Procedures (including  critical care time) Labs Review Labs Reviewed  COMPREHENSIVE METABOLIC PANEL - Abnormal; Notable for the following:    Sodium 146 (*)    BUN <5 (*)    AST 1698 (*)    ALT 2391 (*)    Alkaline Phosphatase 236 (*)    Total Bilirubin 7.8 (*)    All other components within normal limits  URINALYSIS, ROUTINE W REFLEX MICROSCOPIC - Abnormal; Notable for the following:    Color, Urine AMBER (*)    Hgb urine dipstick LARGE (*)    Bilirubin Urine SMALL (*)    Protein, ur 30 (*)    All other components within normal limits  ETHANOL - Abnormal; Notable for the following:    Alcohol, Ethyl (B) 296 (*)    All other components within normal limits  URINE RAPID DRUG SCREEN (HOSP PERFORMED) - Abnormal; Notable for the following:    Benzodiazepines POSITIVE (*)    Tetrahydrocannabinol POSITIVE (*)    All other components within normal limits  ACETAMINOPHEN LEVEL - Abnormal; Notable for the following:    Acetaminophen (Tylenol), Serum <10 (*)    All other components within normal limits  BILIRUBIN, FRACTIONATED(TOT/DIR/INDIR) - Abnormal; Notable for the following:    Total Bilirubin 8.1 (*)    Bilirubin, Direct 5.0 (*)    Indirect Bilirubin 3.1 (*)    All other components within normal limits  CBG MONITORING, ED - Abnormal; Notable for the following:    Glucose-Capillary 103 (*)    All other components within normal limits  URINE MICROSCOPIC-ADD ON  CK  CBC WITH DIFFERENTIAL/PLATELET  LIPASE, BLOOD  PROTIME-INR  SALICYLATE LEVEL  HEPATITIS PANEL, ACUTE  ANTI-SMOOTH MUSCLE ANTIBODY, IGG  CERULOPLASMIN  IRON AND TIBC  GC/CHLAMYDIA PROBE AMP ([place])    Imaging Review Ct Head Wo Contrast  12/30/2014   CLINICAL DATA:  Altered mental status. Multifocal abrasions and bruises today. Initial encounter.  EXAM: CT HEAD WITHOUT CONTRAST  CT CERVICAL SPINE WITHOUT CONTRAST  TECHNIQUE: Multidetector CT imaging of the head and cervical spine was performed following the standard protocol  without intravenous contrast. Multiplanar CT image reconstructions of the cervical spine were also generated.  COMPARISON:  None.  FINDINGS: CT HEAD FINDINGS  The brain appears normal without hemorrhage, infarct, mass lesion, mass effect, midline shift or abnormal extra-axial fluid collection. Soft tissue contusion above the right eye is noted. No underlying fracture or foreign body is identified. There also appears to be a scalp laceration on the left side of the head without underlying fracture. Imaged paranasal sinuses and mastoid air cells are clear.  CT CERVICAL SPINE FINDINGS  No fracture or malalignment of the cervical spine is identified. Shallow appearing disc bulge and endplate spur are noted at C4-5.  IMPRESSION: Contusion above the right eye and likely scalp laceration on the left. Negative for fracture or acute intracranial abnormality.  No acute abnormality cervical spine. Shallow disc bulge and mild endplate spur C4-5 noted.   Electronically Signed   By: Maisie Fus  Dalessio M.D.   On: 12/30/2014 08:52   Ct Cervical Spine Wo Contrast  12/30/2014   CLINICAL DATA:  Altered mental status. Multifocal abrasions and bruises today. Initial encounter.  EXAM: CT HEAD WITHOUT CONTRAST  CT CERVICAL SPINE WITHOUT CONTRAST  TECHNIQUE: Multidetector CT imaging of the head and cervical spine was performed following the standard protocol without intravenous contrast. Multiplanar CT image reconstructions of the cervical spine were also generated.  COMPARISON:  None.  FINDINGS: CT HEAD FINDINGS  The brain appears normal without hemorrhage, infarct, mass lesion, mass effect, midline shift or abnormal extra-axial fluid collection. Soft tissue contusion above the right eye is noted. No underlying fracture or foreign body is identified. There also appears to be a scalp laceration on the left side of the head without underlying fracture. Imaged paranasal sinuses and mastoid air cells are clear.  CT CERVICAL SPINE FINDINGS   No fracture or malalignment of the cervical spine is identified. Shallow appearing disc bulge and endplate spur are noted at C4-5.  IMPRESSION: Contusion above the right eye and likely scalp laceration on the left. Negative for fracture or acute intracranial abnormality.  No acute abnormality cervical spine. Shallow disc bulge and mild endplate spur C4-5 noted.   Electronically Signed   By: Drusilla Kannerhomas  Dalessio M.D.   On: 12/30/2014 08:52   [place] Abdomen Limited  12/30/2014   CLINICAL DATA:  Right upper quadrant pain. Elevated liver function tests.  EXAM: US ABDOMEN LIMITED - RIGHT UPPER QUADRANT  COMPARISON:  None.  FINDINGS: Gallbladder:  No gallstones or wall thickening visualized. No sonographic Murphy sign noted.  Common bile duct:  Diameter: 0.3 cm  Liver:  No focal lesion identified. Within normal limits in parenchymal echogenicity.  IMPRESSION: Negative exam.   Electronically Signed   By: Drusilla Kannerhomas  Dalessio M.D.   On: 12/30/2014 13:19     EKG Interpretation   Date/Time:  Monday Dec 30 2014 06:58:47 EDT Ventricular Rate:  68 PR Interval:  139 QRS Duration: 106 QT Interval:  396 QTC Calculation: 421 R Axis:   -93 Text Interpretation:  Age not entered, assumed to be  34 years old for  purpose of ECG interpretation Sinus rhythm Right superior axis ST  elevation suggests acute pericarditis No old tracing to compare Confirmed  by Sekai Gitlin  MD, Demoni Gergen ([id number]) on 12/30/2014 7:14:14 AM      MDM   Final diagnoses:  Transaminitis  Alcohol intoxication, with unspecified complication    Patient's mental status has improved as the patient sobered. As he improved he was questioned more about his abdominal pain given that significant transaminitis is seen as above. He remarks that he has had right upper quadrant pain for quite some time, about 1 week. Has also been having dark urine that is likely from bilirubin. Ultrasound shows no acute abnormalities in the right upper quadrant. This elevation is  significant and much more than would be typical from a night of alcohol intoxication. Denies prior history of hepatitis. He adamantly denies history of Tylenol use, states he uses ibuprofen daily but not Tylenol. LAD on further labs but will need admission for further workup of his transaminitis.    Pricilla LovelessScott Kynli Chou, MD 12/30/14 [phone]

## 2014-12-30 NOTE — ED Notes (Addendum)
Pt arrives via EMS, was getting drunk at food truck festival and then [employer] [place] last night, was found knocking on strangers door covered in lacerations and abrasions. Assumed that pt slept somewhere outside due to cold skin. Six hours unaccounted for.
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
## 2014-12-31 ENCOUNTER — Encounter: Payer: Self-pay | Admitting: Internal Medicine

## 2014-12-31 DIAGNOSIS — B169 Acute hepatitis B without delta-agent and without hepatic coma: Secondary | ICD-10-CM | POA: Insufficient documentation

## 2014-12-31 LAB — GC/CHLAMYDIA PROBE AMP (~~LOC~~) NOT AT ARMC
CHLAMYDIA, DNA PROBE: NEGATIVE
NEISSERIA GONORRHEA: NEGATIVE

## 2014-12-31 LAB — HEPATITIS PANEL, ACUTE
HCV Ab: NEGATIVE
HEP A IGM: NONREACTIVE
HEP B S AG: POSITIVE — AB
Hep B C IgM: REACTIVE — AB

## 2014-12-31 LAB — COMPREHENSIVE METABOLIC PANEL
ALK PHOS: 191 U/L — AB (ref 38–126)
ALT: 1835 U/L — AB (ref 17–63)
AST: 1222 U/L — ABNORMAL HIGH (ref 15–41)
Albumin: 2.8 g/dL — ABNORMAL LOW (ref 3.5–5.0)
Anion gap: 8 (ref 5–15)
BILIRUBIN TOTAL: 5.2 mg/dL — AB (ref 0.3–1.2)
BUN: 8 mg/dL (ref 6–20)
CHLORIDE: 110 mmol/L (ref 101–111)
CO2: 27 mmol/L (ref 22–32)
Calcium: 9 mg/dL (ref 8.9–10.3)
Creatinine, Ser: 1.28 mg/dL — ABNORMAL HIGH (ref 0.61–1.24)
GFR calc non Af Amer: >60 mL/min (ref >60)
GLUCOSE: 97 mg/dL (ref 65–99)
Potassium: 4 mmol/L (ref 3.5–5.1)
SODIUM: 145 mmol/L (ref 135–145)
Total Protein: 5.7 g/dL — ABNORMAL LOW (ref 6.5–8.1)

## 2014-12-31 LAB — CBC
HEMATOCRIT: 40.7 % (ref 39.0–52.0)
HEMOGLOBIN: 13.2 g/dL (ref 13.0–17.0)
MCH: 29.7 pg (ref 26.0–34.0)
MCHC: 32.4 g/dL (ref 30.0–36.0)
MCV: 91.5 fL (ref 78.0–100.0)
Platelets: 193 10*3/uL (ref 150–400)
RBC: 4.45 MIL/uL (ref 4.22–5.81)
RDW: 14.9 % (ref 11.5–15.5)
WBC: 4.1 10*3/uL (ref 4.0–10.5)

## 2014-12-31 LAB — HIV ANTIBODY (ROUTINE TESTING W REFLEX): HIV SCREEN 4TH GENERATION: NONREACTIVE

## 2014-12-31 LAB — ANTI-SMOOTH MUSCLE ANTIBODY, IGG: F-Actin IgG: 18 Units (ref 0–19)

## 2014-12-31 LAB — CERULOPLASMIN: Ceruloplasmin: 30.5 mg/dL (ref 16.0–31.0)

## 2014-12-31 LAB — HEPATITIS B SURF AG CONFIRMATION: Hepatitis B Surf Ag Confirmation: POSITIVE — AB
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
## 2014-12-31 NOTE — Progress Notes (Signed)
MS 4 Roger MallardMichael Iglesia spoke with ID Dr. Ninetta LightsHatcher who had no recommendations prior to discharge other than check HBV viral load prior to discharge.  We will see patient in clinic 01/05/14 at 10 am and he will f/u with ID Dr. Daiva EvesVan Dam 01/06/15 at 9 am.  We will check CMET, CBC, INR at f/u clinic visit on 5/23.    Shirlee LatchMcLean MD

## 2014-12-31 NOTE — Progress Notes (Signed)
Roger Wright to be D/C'd Home per MD order.  Discussed with the patient and all questions fully answered.  VSS.  IV catheter discontinued intact. Site without signs and symptoms of complications. Dressing and pressure applied.   An After Visit Summary was printed and given to the patient. Patient made aware of both appointments to attend.   D/c education completed with patient/family including follow up instructions, medication list, d/c activities limitations if indicated, with other d/c instructions as indicated by MD - patient able to verbalize understanding, all questions fully answered. Information provided about tobacco cessation.   Patient instructed to return to ED, call 911, or call MD for any changes in condition.   Patient d/c via private auto.   [name], [name] 12/31/2014 3:41 PM

## 2014-12-31 NOTE — Discharge Instructions (Signed)
Please follow up with Infectious Disease as scheduled. Take care Hepatitis B Hepatitis B is a viral infection of the liver. Over half the people who become infected with hepatitis B never feel sick. However, some may later develop long-term liver disease (chronic hepatitis). There are 2 phases of the disease: sudden (acute) and longstanding (chronic). CAUSES Hepatitis B is caused by the hepatitis B virus (HBV). It can enter the body by sharing needles contaminated with blood from an infected person, by sharing intimate items such as toothbrushes and razors, or by sex with an infected person. A baby can get HBV from its birth mother. A caregiver may also get it from exposure to the blood of an infected patient by way of a cut or needle stick.  SYMPTOMS Acute Phase Many cases of acute HBV infection are mild and cause few problems.Some people may not even realize they are sick.Symptoms in others may last a few weeks to several months and include:  Loss of appetite.  Feeling very tired.  Nausea.  Vomiting.  Abdominal pain.  Dark yellow urine.  Yellow skin and eyes (jaundice). Chronic Phase  About 5% of people who get HBV infection become "chronic carriers." They often have no symptoms, but the virus stays in their body. They may spread the virus to others and can get long-term liver disease. The younger a child is when the infection starts, the more likely that child will be a carrier.  About 25% of chronic HBV carriers get a disease called "chronic active hepatitis." These people may develop scarring of the liver (cirrhosis), liver failure, or liver cancer. DIAGNOSIS Your caregiver can do a blood test to see if you have the disease. TREATMENT Acute hepatitis B does not usually require any drug treatment. It is important to avoid medicines such as acetaminophen that may cause increasing liver damage.  Treatment with many antiviral drugs is available and recommended for some patients with  hepatitis B infection. The goal is to reduce the risk of progressive chronic liver disease, transmission of infection to others, and other long-term complications such as cirrhosis, liver failure, and liver cancer. Drug treatment is often advised for people with:  Acute liver failure.  Clinical complications of cirrhosis.  Cirrhosis or advanced fibrosis with high serum measurements of viral DNA.  Reactivation of chronic HBV after chemotherapy or immunosuppression. Immediate drug treatment is not often advised for patients who have chronic infection but normal liver enzyme tests or patients who have a positive hepatitis B DNA test in blood but no other signs of active infection.Patients may have other circumstances that suggest a need or potential benefit from drug treatment. Successful treatment currently requires taking treatment drugs over a long period of time. An injected drug (interferon) may be given daily, 3 times a week, or once weekly for up to 1 year. An oral drug treatment plan may require daily dosing for many years or indefinitely, in order to prevent infection reactivation and worsening of liver disease. Side effects from these drugs are common and some may be very serious. Your response to treatment must be carefully monitored by both you and your caregiver throughout the entire treatment period. PREVENTION Hepatitis B vaccine is highly effective in preventing a hepatitis B infection.The vaccine is recommended worldwide for all newborns of hepatitis B infected mothers, and in many countries for all newborns. Hepatitis B vaccine is also recommended in the [place] for other people at higher than normal risk of getting an infection, including:  Sexually active  people with multiple sex partners.  Homosexual and bisexual men.  People who live with someone who has hepatitis B.  Injection drug users.  Healthcare workers.  Patients on chronic hemodialysis and patients who need repeated  blood or blood product transfusions.  Patients with chronic liver disease due to any cause.  Unvaccinated people traveling to areas with high levels of local HBV infection.  Patients with diabetes. Hepatitis B immune globulin (HBIG) is often given with hepatitis B vaccine to people who have been exposed to blood contaminated with HBV. The HBIG protects you from the virus for the first 1 to 3 months. After that, the hepatitis B vaccine takes over and gives you long-term protection. Your caregiver will help you decide whether and when to get these shots following exposure to HBV. Healthcare workers need to avoid injuries and wear appropriate protective equipment such as gloves, gowns, and face masks when performing invasive medical or nursing procedures.  HOME CARE INSTRUCTIONS   Rest when you feel tired, and eat when you are hungry.  Avoid a sexual relationship until advised otherwise by your caregiver.  Avoid activities that could expose other people to your blood. Examples include sharing a toothbrush, nail clippers, razors, and needles.  This infection is contagious. Follow your caregiver's instructions in order to avoid spread of the infection.  Do not take any medicines until your caregiver says it is okay. This includes over-the-counter drugs such as acetominophen that are usually taken for fever or pain. SEEK IMMEDIATE MEDICAL CARE IF:   You are unable to eat or drink.  You feel sick to your stomach (nauseous) or throw up (vomit).  You feel confused.  Jaundice becomes more severe.  You have trouble breathing, a rash, or swelling of the skin, throat, mouth, or face. You may be having an allergic reaction to the medicine in the shot.  You start twitching or shaking (seizure).  You become very sleepy or have trouble waking up. MAKE SURE YOU:   Understand these instructions.  Will watch your condition.  Will get help right away if you are not doing well or get  worse. Document Released: 07/30/2000 Document Revised: 10/25/2011 Document Reviewed: 11/09/2013 [facility] Patient Information 2015 [place]. This information is not intended to replace advice given to you by your health care provider. Make sure you discuss any questions you have with your health care provider.    Alcohol Intoxication Alcohol intoxication occurs when you drink enough alcohol that it affects your ability to function. It can be mild or very severe. Drinking a lot of alcohol in a short time is called binge drinking. This can be very harmful. Drinking alcohol can also be more dangerous if you are taking medicines or other drugs. Some of the effects caused by alcohol may include:  Loss of coordination.  Changes in mood and behavior.  Unclear thinking.  Trouble talking (slurred speech).  Throwing up (vomiting).  Confusion.  Slowed breathing.  Twitching and shaking (seizures).  Loss of consciousness. HOME CARE  Do not drive after drinking alcohol.  Drink enough water and fluids to keep your pee (urine) clear or pale yellow. Avoid caffeine.  Only take medicine as told by your doctor. GET HELP IF:  You throw up (vomit) many times.  You do not feel better after a few days.  You frequently have alcohol intoxication. Your doctor can help decide if you should see a substance use treatment counselor. GET HELP RIGHT AWAY IF:  You become shaky when  you stop drinking.  You have twitching and shaking.  You throw up blood. It may look bright red or like coffee grounds.  You notice blood in your poop (bowel movements).  You become lightheaded or pass out (faint). MAKE SURE YOU:   Understand these instructions.  Will watch your condition.  Will get help right away if you are not doing well or get worse. Document Released: 01/19/2008 Document Revised: 04/04/2013 Document Reviewed: 01/05/2013 [facility] Patient Information 2015 [place], [place]. This information  is not intended to replace advice given to you by your health care provider. Make sure you discuss any questions you have with your health care provider.

## 2014-12-31 NOTE — Progress Notes (Signed)
Telephone order from MD not to d/c patient until last lab draw is drawn.

## 2015-01-03 NOTE — Discharge Summary (Signed)
Name: Roger Wright MRN: [id number] DOB: 03/17/1981 34 y.o. PCP: No Pcp Per Patient  Date of Admission: 12/30/2014  6:46 AM Date of Discharge: 01/03/2015 Attending Physician: No att. providers found  Discharge Diagnosis:  Active Problems:   Transaminitis   Acute hepatitis   Alcohol intoxication   Acute hepatitis B  Discharge Medications:   Medication List    STOP taking these medications        doxycycline 100 MG capsule  Commonly known as:  VIBRAMYCIN     HYDROcodone-acetaminophen 5-325 MG per tablet  Commonly known as:  NORCO      TAKE these medications        GARLIC PO  Take by mouth.     ibuprofen 200 MG tablet  Commonly known as:  ADVIL,MOTRIN  Take 400-600 mg by mouth every 6 (six) hours as needed for pain.        Disposition and follow-up:   [facility] Wright [facility].  At the hospital follow up visit please address:  1.  Abdominal pain? Nausea, vomiting? Jaundice?   2.  Labs / imaging needed at time of follow-up: CMP  3.  Pending labs/ test needing follow-up: Hep B Viral Load  Follow-up Appointments:     Follow-up Information    Follow up with Acey Lavornelius Van Dam, MD. Go on 01/15/2015.   Specialty:  Infectious Diseases   Why:  For hospital follow-up and to establish care. Appointment is at 9:00 AM.   Contact information:   [address] [place] [address] [phone]       Discharge Instructions: Discharge Instructions    Discharge instructions    Complete by:  As directed   Please follow up with Dr. Daiva EvesVan Dam 01/15/15 at 9 am          Procedures Performed:  Ct Head Wo Contrast  12/30/2014   CLINICAL DATA:  Altered mental status. Multifocal abrasions and bruises today. Initial encounter.  EXAM: CT HEAD WITHOUT CONTRAST  CT CERVICAL SPINE WITHOUT CONTRAST  TECHNIQUE: Multidetector CT imaging of the head and cervical spine was performed following the  standard protocol without intravenous contrast. Multiplanar CT image reconstructions of the cervical spine were also generated.  COMPARISON:  None.  FINDINGS: CT HEAD FINDINGS  The brain appears normal without hemorrhage, infarct, mass lesion, mass effect, midline shift or abnormal extra-axial fluid collection. Soft tissue contusion above the right eye is noted. No underlying fracture or foreign body is identified. There also appears to be a scalp laceration on the left side of the head without underlying fracture. Imaged paranasal sinuses and mastoid air cells are clear.  CT CERVICAL SPINE FINDINGS  No fracture or malalignment of the cervical spine is identified. Shallow appearing disc bulge and endplate spur are noted at C4-5.  IMPRESSION: Contusion above the right eye and likely scalp laceration on the left. Negative for fracture or acute intracranial abnormality.  No acute abnormality cervical spine. Shallow disc bulge and mild endplate spur C4-5 noted.   Electronically Signed   By: Drusilla Kannerhomas  Dalessio M.D.   On: 12/30/2014 08:52   Ct Cervical Spine Wo Contrast  12/30/2014   CLINICAL DATA:  Altered mental status. Multifocal abrasions and bruises today. Initial encounter.  EXAM: CT HEAD WITHOUT CONTRAST  CT CERVICAL SPINE WITHOUT CONTRAST  TECHNIQUE: Multidetector CT imaging of the head and cervical spine was performed following the standard protocol without intravenous contrast. Multiplanar CT  image reconstructions of the cervical spine were also generated.  COMPARISON:  None.  FINDINGS: CT HEAD FINDINGS  The brain appears normal without hemorrhage, infarct, mass lesion, mass effect, midline shift or abnormal extra-axial fluid collection. Soft tissue contusion above the right eye is noted. No underlying fracture or foreign body is identified. There also appears to be a scalp laceration on the left side of the head without underlying fracture. Imaged paranasal sinuses and mastoid air cells are clear.  CT CERVICAL  SPINE FINDINGS  No fracture or malalignment of the cervical spine is identified. Shallow appearing disc bulge and endplate spur are noted at C4-5.  IMPRESSION: Contusion above the right eye and likely scalp laceration on the left. Negative for fracture or acute intracranial abnormality.  No acute abnormality cervical spine. Shallow disc bulge and mild endplate spur C4-5 noted.   Electronically Signed   By: Drusilla Kannerhomas  Dalessio M.D.   On: 12/30/2014 08:52   [place] Abdomen Limited  12/30/2014   CLINICAL DATA:  Right upper quadrant pain. Elevated liver function tests.  EXAM: US ABDOMEN LIMITED - RIGHT UPPER QUADRANT  COMPARISON:  None.  FINDINGS: Gallbladder:  No gallstones or wall thickening visualized. No sonographic Murphy sign noted.  Common bile duct:  Diameter: 0.3 cm  Liver:  No focal lesion identified. Within normal limits in parenchymal echogenicity.  IMPRESSION: Negative exam.   Electronically Signed   By: Drusilla Kannerhomas  Dalessio M.D.   On: 12/30/2014 13:19    Admission HPI: Roger AoMichael Talavera is a 34 y.o. M with no significant PMH who was transferred via EMS after being found knocking on a stranger's door with lacs and abrasions. Per patient, he attended a food truck festival last night and went to a bar afterwards, and then believed he went home. He denied recollection of how he was injured. He reported drinking 4+ beers and ~15 oz of liquor that night. The patient reported feeling sick with a "churning" indigestion feeling about 3 weeks prior to admission. He denied vomiting or diarrhea at that time. Subsequently, he reported deep LLQ abdominal pain consistent for the 3 weeks prior to admission that was intermittently described as stabbing or pressure. Approximately two weeks prior to admission he reported noticing his urine looked darker, the color of a light beer. He denied dysuria, frequency, or urgency. He denied any red color to urine. On the day prior to admission, 12/28/2013, he reported noticing that his sclerae  were yellow. The patient takes multiple herbal/vitamin over-the-counter supplements. He stopped taking one about three weeks prior to admission because it caused nausea. He has stopped working out for the several weeks prior to admission due to muscle pain. For the last week before admission he was fasting, eating one meal daily of blended kale and strawberries.  He takes 4 ibuprofen about 3 times weekly; he denied Tylenol or Aspirin use. He reported using marijuana but denied any other recreational drug use. The patient is sexually active with men only and uses protection. He has had gonorrhea but no other STIs that he knows of. He has been tested for HIV before and was negative. He has two tattoos, none within the past year. He denied sick contacts. He denied nausea or vomiting at the time of presentation. He denied dysuria, frequency, urgency. He reported one bowel movement daily, normal color, no loose stools. The patient was admitted to the internal medicine teaching service on 12/30/2014. He received IV hydration and oral pain control. He was subsequently discharged to home on  12/31/2014. On the day of discharge, the patient was in stable condition and asymptomatic. No procedures were performed during the course of this hospitalization.  Hospital Course by problem list:   1. Acute Hepatitis B: On admission, the patient's LFTs were sharply elevated at AST 1698, ALT 2391. The patient had several risk factors for liver disease, including questionable herbal/supplement ingestion, binge drinking, MSM with h/o STI, tattoos. Liver ultrasound was negative for parenchymal changes or biliary tree disease. Liver aminotransferases trended downward on re-check on the day of discharge. HBV C IgM was reactive, confirming acute HBV infection. HBV viral load was drawn and is pending at the time of discharge. HCV and HAV antibodies were negative. Iron panel was ordered and was not suggestive of hemochromatosis;  ceruloplasmin was within normal limits. The patient will follow up in ID clinic for HBV surveillance and management.   Discharge Vitals:   BP 163/95 mmHg  Pulse 72  Temp(Src) 97.5 F (36.4 C) (Oral)  Resp 16  Ht  (1.753 m)  Wt 183 lb 6.4 oz (83.19 kg)  BMI 27.07 kg/m2  SpO2 100%  Discharge Labs:  No results found for this or any previous visit (from the past 24 hour(s)).  Signed: Courtney Paris, MD 01/03/2015, 6:48 PM    Services Ordered on Discharge: none Equipment Ordered on Discharge: none

## 2015-01-06 ENCOUNTER — Ambulatory Visit: Payer: Self-pay | Admitting: Internal Medicine

## 2015-01-07 LAB — NGI HBV SUPERQUANT: HEPATITIS B DNA: 230000 {copies}/mL

## 2015-01-09 ENCOUNTER — Ambulatory Visit: Payer: Self-pay | Admitting: Internal Medicine

## 2015-01-15 ENCOUNTER — Ambulatory Visit: Payer: Self-pay | Admitting: Infectious Disease

## 2015-04-29 ENCOUNTER — Encounter: Payer: Self-pay | Admitting: *Deleted

## 2018-09-20 ENCOUNTER — Emergency Department (HOSPITAL_COMMUNITY): Payer: Self-pay

## 2018-09-20 ENCOUNTER — Encounter (HOSPITAL_COMMUNITY): Payer: Self-pay

## 2018-09-20 ENCOUNTER — Emergency Department (HOSPITAL_COMMUNITY)
Admission: EM | Admit: 2018-09-20 | Discharge: 2018-09-20 | Disposition: A | Discharge status: Home / Self Care | Payer: Self-pay | Attending: Emergency Medicine | Admitting: Emergency Medicine

## 2018-09-20 ENCOUNTER — Other Ambulatory Visit: Payer: Self-pay

## 2018-09-20 DIAGNOSIS — Q5522 Retractile testis: Secondary | ICD-10-CM | POA: Insufficient documentation

## 2018-09-20 DIAGNOSIS — F17200 Nicotine dependence, unspecified, uncomplicated: Secondary | ICD-10-CM | POA: Insufficient documentation

## 2018-09-20 DIAGNOSIS — N201 Calculus of ureter: Secondary | ICD-10-CM | POA: Insufficient documentation

## 2018-09-20 LAB — URINALYSIS, ROUTINE W REFLEX MICROSCOPIC
Bilirubin Urine: NEGATIVE
Glucose, UA: NEGATIVE mg/dL
Ketones, ur: NEGATIVE mg/dL
Nitrite: NEGATIVE
Protein, ur: 30 mg/dL — AB
RBC / HPF: >50 RBC/hpf — ABNORMAL HIGH (ref 0–5)
Specific Gravity, Urine: 1.012 (ref 1.005–1.030)
pH: 6 (ref 5.0–8.0)

## 2018-09-20 LAB — COMPREHENSIVE METABOLIC PANEL
ALT: 14 U/L (ref 0–44)
AST: 25 U/L (ref 15–41)
Albumin: 4.3 g/dL (ref 3.5–5.0)
Alkaline Phosphatase: 68 U/L (ref 38–126)
Anion gap: 10 (ref 5–15)
BUN: 13 mg/dL (ref 6–20)
CO2: 21 mmol/L — ABNORMAL LOW (ref 22–32)
Calcium: 9.3 mg/dL (ref 8.9–10.3)
Chloride: 106 mmol/L (ref 98–111)
Creatinine, Ser: 1.57 mg/dL — ABNORMAL HIGH (ref 0.61–1.24)
GFR calc Af Amer: >60 mL/min (ref >60)
GFR calc non Af Amer: 56 mL/min — ABNORMAL LOW (ref >60)
Glucose, Bld: 127 mg/dL — ABNORMAL HIGH (ref 70–99)
Potassium: 3.5 mmol/L (ref 3.5–5.1)
Sodium: 137 mmol/L (ref 135–145)
Total Bilirubin: 0.4 mg/dL (ref 0.3–1.2)
Total Protein: 7.4 g/dL (ref 6.5–8.1)

## 2018-09-20 LAB — CBC
HCT: 38.9 % — ABNORMAL LOW (ref 39.0–52.0)
Hemoglobin: 12 g/dL — ABNORMAL LOW (ref 13.0–17.0)
MCH: 27.6 pg (ref 26.0–34.0)
MCHC: 30.8 g/dL (ref 30.0–36.0)
MCV: 89.4 fL (ref 80.0–100.0)
Platelets: 229 10*3/uL (ref 150–400)
RBC: 4.35 MIL/uL (ref 4.22–5.81)
RDW: 14.2 % (ref 11.5–15.5)
WBC: 8.3 10*3/uL (ref 4.0–10.5)
nRBC: 0 % (ref 0.0–0.2)

## 2018-09-20 LAB — LIPASE, BLOOD: Lipase: 32 U/L (ref 11–51)

## 2018-09-20 MED ORDER — PROMETHAZINE HCL 25 MG/ML IJ SOLN
12.5000 mg | Freq: Once | INTRAMUSCULAR | Status: AC
  Administered 2018-09-20: 12.5 mg via INTRAVENOUS
  Filled 2018-09-20: qty 1

## 2018-09-20 MED ORDER — KETOROLAC TROMETHAMINE 15 MG/ML IJ SOLN
15.0000 mg | Freq: Once | INTRAMUSCULAR | Status: AC
  Administered 2018-09-20: 15 mg via INTRAVENOUS
  Filled 2018-09-20: qty 1

## 2018-09-20 MED ORDER — HYDROCODONE-ACETAMINOPHEN 5-325 MG PO TABS: 2.0000 | ORAL_TABLET | ORAL | 0 refills | Status: DC | PRN

## 2018-09-20 MED ORDER — HYDROMORPHONE HCL 1 MG/ML IJ SOLN
1.0000 mg | Freq: Once | INTRAMUSCULAR | Status: AC
  Administered 2018-09-20: 1 mg via INTRAVENOUS
  Filled 2018-09-20: qty 1

## 2018-09-20 MED ORDER — SODIUM CHLORIDE 0.9 % IV BOLUS
1000.0000 mL | Freq: Once | INTRAVENOUS | Status: AC
  Administered 2018-09-20: 1000 mL via INTRAVENOUS

## 2018-09-20 MED ORDER — ONDANSETRON HCL 4 MG PO TABS: 4.0000 mg | ORAL_TABLET | Freq: Four times a day (QID) | ORAL | 0 refills | Status: DC | PRN

## 2018-09-20 NOTE — Discharge Instructions (Addendum)
Take 600 mg of ibuprofen as needed for pain. It is safe to take with prescribed pain medication and you'll get better relief.

## 2018-09-20 NOTE — ED Notes (Signed)
Patient aware we need urine. 

## 2018-09-20 NOTE — ED Notes (Signed)
ED Provider at bedside. 

## 2018-09-20 NOTE — ED Triage Notes (Signed)
Pt arrived with complaints that started today with right sided abdominal pain. Pt given 100 of fentanyl with EMS. Denies NVD.

## 2018-09-20 NOTE — ED Provider Notes (Signed)
[facility]-EMERGENCY DEPT Provider Note   CSN: [id number] Arrival date & time: 09/20/18  0703     History   Chief Complaint Chief Complaint  Patient presents with  . Abdominal Pain    HPI Roger Wright is a 38 y.o. male.  HPI  38 year old male with right side abdominal pain.  Acute onset early this morning.  Pain is in the right flank to right lower quadrant.  Constant since onset.  No appreciable exacerbating factors.  Associated nausea.  He self-induced vomiting once.  No fevers or chills.  He has the urge to urinate frequently but only voiding small amounts.  No dysuria.  No fevers or chills.  No history of similar symptoms.  Past Medical History:  Diagnosis Date  . Alcohol intoxication delirium (HCC) 12/30/2014    Patient Active Problem List   Diagnosis Date Noted  . Acute hepatitis B   . Transaminitis 12/30/2014  . Acute hepatitis 12/30/2014  . Alcohol intoxication (HCC)     Past Surgical History:  Procedure Laterality Date  . HERNIA REPAIR          Home Medications    Prior to Admission medications   Medication Sig Start Date End Date Taking? Authorizing Provider  ibuprofen (ADVIL,MOTRIN) 200 MG tablet Take 400-600 mg by mouth every 6 (six) hours as needed for pain.    Yes [provider]    Family History No family history on file.  Social History Social History   Tobacco Use  . Smoking status: Current Every Day Smoker  . Smokeless tobacco: Never Used  Substance Use Topics  . Alcohol use: Yes    Comment: my drinking is inconsistant, It used to be very heavy  "  . Drug use: No     Allergies   Patient has no known allergies.   Review of Systems Review of Systems  All systems reviewed and negative, other than as noted in HPI.  Physical Exam Updated Vital Signs BP (!) 183/115 Comment: pt moving around  Pulse 67   Temp 98.9 F (37.2 C) (Oral)   Resp 16   Ht 5\' 9"  (1.753 m)   Wt 72.6 kg   SpO2 100%    BMI 23.63 kg/m   Physical Exam Vitals signs and nursing note reviewed.  Constitutional:      Appearance: He is well-developed.     Comments: Laying in bed on the left side.  Appears uncomfortable.  HENT:     Head: Normocephalic and atraumatic.  Eyes:     General:        Right eye: No discharge.        Left eye: No discharge.     Conjunctiva/sclera: Conjunctivae normal.  Neck:     Musculoskeletal: Neck supple.  Cardiovascular:     Rate and Rhythm: Normal rate and regular rhythm.     Heart sounds: Normal heart sounds. No murmur. No friction rub. No gallop.   Pulmonary:     Effort: Pulmonary effort is normal. No respiratory distress.     Breath sounds: Normal breath sounds.  Abdominal:     General: There is no distension.     Palpations: Abdomen is soft.     Tenderness: There is abdominal tenderness.     Comments: Is in the right lower quadrant right flank.  No rebound or guarding.  Right CVA tenderness.  Musculoskeletal:        General: No tenderness.  Skin:    General: Skin is  warm and dry.  Neurological:     Mental Status: He is alert.  Psychiatric:        Behavior: Behavior normal.        Thought Content: Thought content normal.      ED Treatments / Results  Labs (all labs ordered are listed, but only abnormal results are displayed) Labs Reviewed  COMPREHENSIVE METABOLIC PANEL - Abnormal; Notable for the following components:      Result Value   CO2 21 (*)    Glucose, Bld 127 (*)    Creatinine, Ser 1.57 (*)    GFR calc non Af Amer 56 (*)    All other components within normal limits  CBC - Abnormal; Notable for the following components:   Hemoglobin 12.0 (*)    HCT 38.9 (*)    All other components within normal limits  URINALYSIS, ROUTINE W REFLEX MICROSCOPIC - Abnormal; Notable for the following components:   APPearance HAZY (*)    Hgb urine dipstick LARGE (*)    Protein, ur 30 (*)    Leukocytes, UA TRACE (*)    RBC / HPF >50 (*)    Bacteria, UA RARE (*)     All other components within normal limits  LIPASE, BLOOD    EKG None  Radiology Ct Renal Stone Study  Result Date: 09/20/2018 CLINICAL DATA:  Right-sided abdominal pain EXAM: CT ABDOMEN AND PELVIS WITHOUT CONTRAST TECHNIQUE: Multidetector CT imaging of the abdomen and pelvis was performed following the standard protocol without oral or IV contrast. COMPARISON:  None. FINDINGS: Lower chest: There is bibasilar lung atelectatic change. Hepatobiliary: No focal liver lesions are appreciable on this noncontrast enhanced study. Gallbladder wall does not appear appreciably thickened. There is no biliary duct dilatation. Pancreas: No pancreatic mass or inflammatory focus. Spleen: No splenic lesions are evident. Adrenals/Urinary Tract: Adrenals appear normal bilaterally. Left kidney appears somewhat malrotated. No renal masses are evident. There is moderate hydronephrosis on the right. There is no appreciable hydronephrosis on the left. There is no intrarenal calculus on either side. There is a 2 mm calculus in the distal right ureter near the ureterovesical junction. No other ureteral calculi are evident. Urinary bladder wall appears slightly thickened. Stomach/Bowel: There is no appreciable bowel wall or mesenteric thickening. There is no evident bowel obstruction. There is no free air or portal venous air. Vascular/Lymphatic: No abdominal aortic aneurysm. No vascular lesions are evident on this noncontrast enhanced study. There is no evident adenopathy in the abdomen or pelvis. Reproductive: Prostate and seminal vesicles appear normal in size and contour. There are several small prostatic calculi. Each testis extends into the lower inguinal ring region. Other: Appendix appears normal. No abscess or ascites is evident in the abdomen or pelvis. Musculoskeletal: There is a probable small bone island in the left iliac crest. No lytic or destructive bone lesions evident. No intramuscular lesions evident.  IMPRESSION: 1. Moderate hydronephrosis on the right with 2 mm calculus in the distal right ureter. 2. Left kidney appears somewhat malrotated. No hydronephrosis or renal/ureteral calculus evident on the left side. 3. Each testis is seen in the lower inguinal ring region. Question mobile testes versus incompletely distended testes. This finding warrants clinical assessment. 4. No evident bowel obstruction. No abscess in the abdomen or pelvis. Appendix appears normal. 5.  Several small prostatic calculi noted. Electronically Signed   By: Bretta Bang III M.D.   On: 09/20/2018 09:41    Procedures Procedures (including critical care time)  Medications Ordered in  ED Medications  ketorolac (TORADOL) 15 MG/ML injection 15 mg (15 mg Intravenous Given 09/20/18 0817)  HYDROmorphone (DILAUDID) injection 1 mg (1 mg Intravenous Given 09/20/18 0819)  sodium chloride 0.9 % bolus 1,000 mL (1,000 mLs Intravenous New Bag/Given (Non-Interop) 09/20/18 0815)  promethazine (PHENERGAN) injection 12.5 mg (12.5 mg Intravenous Given 09/20/18 0819)     Initial Impression / Assessment and Plan / ED Course  I have reviewed the triage vital signs and the nursing notes.  Pertinent labs & imaging results that were available during my care of the patient were reviewed by me and considered in my medical decision making (see chart for details).     38 year old male with right abdominal/right flank pain.  Suspect ureteral colic.  No history of kidney stones.  Will image.  Labs and UA.  Symptomatic treatment and reassessment.  Less likely appendicitis, UTI, testicular torsion, hernia, biliary colic, PUD, AAA, etc.  CT significant for 2 mm right ureteral stone.  Also noted that testicles in the bilateral inguinal canals.  Spoke with patient further.  He describes retractile testicles.  He states that they will "go up and down."  Denies any significant pain or new symptoms with regards to that.  He states that he can manually bring  them down.  Final Clinical Impressions(s) / ED Diagnoses   Final diagnoses:  Right ureteral stone  Retractile testis    ED Discharge Orders    None       Raeford RazorKohut, Emberlie Gotcher, MD 09/20/18 1054

## 2018-09-20 NOTE — ED Notes (Signed)
Patient transported to CT 

## 2020-06-13 ENCOUNTER — Other Ambulatory Visit: Payer: Self-pay

## 2020-06-13 ENCOUNTER — Encounter (HOSPITAL_COMMUNITY): Payer: Self-pay | Admitting: Emergency Medicine

## 2020-06-13 ENCOUNTER — Emergency Department (HOSPITAL_COMMUNITY): Payer: Self-pay

## 2020-06-13 ENCOUNTER — Emergency Department (HOSPITAL_COMMUNITY)
Admission: EM | Admit: 2020-06-13 | Discharge: 2020-06-13 | Disposition: A | Discharge status: Home / Self Care | Payer: Self-pay | Attending: Emergency Medicine | Admitting: Emergency Medicine

## 2020-06-13 DIAGNOSIS — Z711 Person with feared health complaint in whom no diagnosis is made: Secondary | ICD-10-CM

## 2020-06-13 DIAGNOSIS — N390 Urinary tract infection, site not specified: Secondary | ICD-10-CM | POA: Insufficient documentation

## 2020-06-13 DIAGNOSIS — N419 Inflammatory disease of prostate, unspecified: Secondary | ICD-10-CM

## 2020-06-13 DIAGNOSIS — R000 Tachycardia, unspecified: Secondary | ICD-10-CM | POA: Insufficient documentation

## 2020-06-13 DIAGNOSIS — R110 Nausea: Secondary | ICD-10-CM | POA: Insufficient documentation

## 2020-06-13 DIAGNOSIS — F17200 Nicotine dependence, unspecified, uncomplicated: Secondary | ICD-10-CM | POA: Insufficient documentation

## 2020-06-13 DIAGNOSIS — Z202 Contact with and (suspected) exposure to infections with a predominantly sexual mode of transmission: Secondary | ICD-10-CM | POA: Insufficient documentation

## 2020-06-13 DIAGNOSIS — N410 Acute prostatitis: Secondary | ICD-10-CM | POA: Insufficient documentation

## 2020-06-13 DIAGNOSIS — Z87442 Personal history of urinary calculi: Secondary | ICD-10-CM | POA: Insufficient documentation

## 2020-06-13 HISTORY — DX: Calculus of kidney: N20.0

## 2020-06-13 LAB — URINALYSIS, ROUTINE W REFLEX MICROSCOPIC
Bilirubin Urine: NEGATIVE
Glucose, UA: NEGATIVE mg/dL
Ketones, ur: NEGATIVE mg/dL
Nitrite: POSITIVE — AB
Protein, ur: 100 mg/dL — AB
RBC / HPF: >50 RBC/hpf — ABNORMAL HIGH (ref 0–5)
Specific Gravity, Urine: 1.012 (ref 1.005–1.030)
WBC, UA: >50 WBC/hpf — ABNORMAL HIGH (ref 0–5)
pH: 6 (ref 5.0–8.0)

## 2020-06-13 LAB — CBC
HCT: 44.2 % (ref 39.0–52.0)
Hemoglobin: 14 g/dL (ref 13.0–17.0)
MCH: 27.2 pg (ref 26.0–34.0)
MCHC: 31.7 g/dL (ref 30.0–36.0)
MCV: 85.8 fL (ref 80.0–100.0)
Platelets: 350 10*3/uL (ref 150–400)
RBC: 5.15 MIL/uL (ref 4.22–5.81)
RDW: 15.4 % (ref 11.5–15.5)
WBC: 11.5 10*3/uL — ABNORMAL HIGH (ref 4.0–10.5)
nRBC: 0 % (ref 0.0–0.2)

## 2020-06-13 LAB — BASIC METABOLIC PANEL
Anion gap: 12 (ref 5–15)
BUN: 11 mg/dL (ref 6–20)
CO2: 24 mmol/L (ref 22–32)
Calcium: 9.7 mg/dL (ref 8.9–10.3)
Chloride: 100 mmol/L (ref 98–111)
Creatinine, Ser: 1.35 mg/dL — ABNORMAL HIGH (ref 0.61–1.24)
GFR, Estimated: >60 mL/min (ref >60)
Glucose, Bld: 139 mg/dL — ABNORMAL HIGH (ref 70–99)
Potassium: 4 mmol/L (ref 3.5–5.1)
Sodium: 136 mmol/L (ref 135–145)

## 2020-06-13 MED ORDER — MORPHINE SULFATE (PF) 4 MG/ML IV SOLN
4.0000 mg | Freq: Once | INTRAVENOUS | Status: AC
  Administered 2020-06-13: 4 mg via INTRAVENOUS
  Filled 2020-06-13: qty 1

## 2020-06-13 MED ORDER — SODIUM CHLORIDE 0.9 % IV BOLUS
1000.0000 mL | Freq: Once | INTRAVENOUS | Status: AC
  Administered 2020-06-13: 1000 mL via INTRAVENOUS

## 2020-06-13 MED ORDER — DOXYCYCLINE HYCLATE 100 MG PO CAPS: 100.0000 mg | ORAL_CAPSULE | Freq: Two times a day (BID) | ORAL | 0 refills | Status: AC

## 2020-06-13 MED ORDER — METRONIDAZOLE 500 MG PO TABS
2000.0000 mg | ORAL_TABLET | Freq: Once | ORAL | Status: AC
  Administered 2020-06-13: 2000 mg via ORAL
  Filled 2020-06-13: qty 4

## 2020-06-13 MED ORDER — CEFTRIAXONE SODIUM 1 G IJ SOLR
500.0000 mg | Freq: Once | INTRAMUSCULAR | Status: AC
  Administered 2020-06-13: 500 mg via INTRAMUSCULAR
  Filled 2020-06-13: qty 10

## 2020-06-13 MED ORDER — LIDOCAINE HCL (PF) 1 % IJ SOLN
1.0000 mL | Freq: Once | INTRAMUSCULAR | Status: AC
  Administered 2020-06-13: 1 mL
  Filled 2020-06-13: qty 30

## 2020-06-13 MED ORDER — ONDANSETRON HCL 4 MG/2ML IJ SOLN
4.0000 mg | Freq: Once | INTRAMUSCULAR | Status: AC
  Administered 2020-06-13: 4 mg via INTRAVENOUS
  Filled 2020-06-13: qty 2

## 2020-06-13 NOTE — ED Provider Notes (Signed)
[facility]-EMERGENCY DEPT Provider Note   CSN: [id number] Arrival date & time: 06/13/20  1832     History Chief Complaint  Patient presents with  . Flank Pain    Roger Wright is a 39 y.o. male with past medical history significant for acute hepatitis, kidney stone.  HPI Patient presents to emergency department today with chief complaint of right flank pain x3 days.  He states has progressively worsened.  He states the pain waxes and wanes.  He describes the pain as a cramping sensation.  Pain radiates to his pelvis.  He endorses associated chills, nausea without emesis.  At first he thought his symptoms were being caused by constipation or gas so he tried taking a stool softener and Gas-X.  He had multiple bowel movements however his pain did not improve.  He does state he has also noticed issues with urine stream, describes it as feeling like the stream is stopped halfway then just drips. He denies any associated dysuria. He did notice white milky discharge from penis today. He has remote history of STDs. He is sexually active with male partners. He had a kidney stone x1 years ago and states this feels exactly the same.  He denies fever, chest pain, pelvic pain, gross hematuria, diarrhea.    Past Medical History:  Diagnosis Date  . Alcohol intoxication delirium (HCC) 12/30/2014  . Kidney stone     Patient Active Problem List   Diagnosis Date Noted  . Acute hepatitis B   . Transaminitis 12/30/2014  . Acute hepatitis 12/30/2014  . Alcohol intoxication (HCC)     Past Surgical History:  Procedure Laterality Date  . HERNIA REPAIR         No family history on file.  Social History   Tobacco Use  . Smoking status: Current Every Day Smoker  . Smokeless tobacco: Never Used  Substance Use Topics  . Alcohol use: Not Currently    Comment: my drinking is inconsistant, It used to be very heavy  "  . Drug use: No    Home Medications Prior to Admission  medications   Medication Sig Start Date End Date Taking? Authorizing Provider  doxycycline (VIBRAMYCIN) 100 MG capsule Take 1 capsule (100 mg total) by mouth 2 (two) times daily for 14 days. 06/13/20 06/27/20  Ashaz Robling, Caroleen Hamman, PA-C  HYDROcodone-acetaminophen (NORCO/VICODIN) 5-325 MG tablet Take 2 tablets by mouth every 4 (four) hours as needed. 09/20/18   Raeford Razor, MD  ibuprofen (ADVIL,MOTRIN) 200 MG tablet Take 400-600 mg by mouth every 6 (six) hours as needed for pain.     [provider]  ondansetron (ZOFRAN) 4 MG tablet Take 1 tablet (4 mg total) by mouth every 6 (six) hours as needed for nausea or vomiting. 09/20/18   Raeford Razor, MD    Allergies    Patient has no known allergies.  Review of Systems   Review of Systems All other systems are reviewed and are negative for acute change except as noted in the HPI.  Physical Exam Updated Vital Signs BP (!) 153/113 (BP Location: Right Arm)   Pulse (!) 120   Temp 99.3 F (37.4 C) (Oral)   Resp 16   Ht 5\' 9"  (1.753 m)   Wt 83.9 kg   SpO2 98%   BMI 27.32 kg/m   Physical Exam Vitals and nursing note reviewed.  Constitutional:      General: He is not in acute distress.    Appearance: He is not  ill-appearing.  HENT:     Head: Normocephalic and atraumatic.     Right Ear: Tympanic membrane and external ear normal.     Left Ear: Tympanic membrane and external ear normal.     Nose: Nose normal.     Mouth/Throat:     Mouth: Mucous membranes are dry.     Pharynx: Oropharynx is clear.  Eyes:     General: No scleral icterus.       Right eye: No discharge.        Left eye: No discharge.     Extraocular Movements: Extraocular movements intact.     Conjunctiva/sclera: Conjunctivae normal.     Pupils: Pupils are equal, round, and reactive to light.  Neck:     Vascular: No JVD.  Cardiovascular:     Rate and Rhythm: Regular rhythm. Tachycardia present.     Pulses: Normal pulses.          Radial pulses are 2+ on the  right side and 2+ on the left side.     Heart sounds: Normal heart sounds.  Pulmonary:     Comments: Lungs clear to auscultation in all fields. Symmetric chest rise. No wheezing, rales, or rhonchi. Abdominal:     Tenderness: There is right CVA tenderness.     Comments: Abdomen is soft, non-distended, and non-tender in all quadrants. No rigidity, no guarding. No peritoneal signs.  Genitourinary:    Comments: Chaperone Viridiana present for exam. No discharge or urethritis noted. No signs of sores or lesions or erythema on the penis or testicles. The penis and testicles are nontender. No testicular masses or swelling. No scrotal swelling. No signs of any inguinal hernias. Cremaster reflex present bilaterally.   Musculoskeletal:        General: Normal range of motion.     Cervical back: Normal range of motion.  Skin:    General: Skin is warm and dry.     Capillary Refill: Capillary refill takes less than 2 seconds.  Neurological:     Mental Status: He is oriented to person, place, and time.     GCS: GCS eye subscore is 4. GCS verbal subscore is 5. GCS motor subscore is 6.     Comments: Fluent speech, no facial droop.  Psychiatric:        Behavior: Behavior normal.     ED Results / Procedures / Treatments   Labs (all labs ordered are listed, but only abnormal results are displayed) Labs Reviewed  URINALYSIS, ROUTINE W REFLEX MICROSCOPIC - Abnormal; Notable for the following components:      Result Value   APPearance CLOUDY (*)    Hgb urine dipstick LARGE (*)    Protein, ur 100 (*)    Nitrite POSITIVE (*)    Leukocytes,Ua LARGE (*)    RBC / HPF >50 (*)    WBC, UA >50 (*)    Bacteria, UA RARE (*)    All other components within normal limits  BASIC METABOLIC PANEL - Abnormal; Notable for the following components:   Glucose, Bld 139 (*)    Creatinine, Ser 1.35 (*)    All other components within normal limits  CBC - Abnormal; Notable for the following components:   WBC 11.5 (*)      All other components within normal limits  URINE CULTURE  RPR  HIV ANTIBODY (ROUTINE TESTING W REFLEX)  GC/CHLAMYDIA PROBE AMP ([place]) NOT AT [facility]    EKG None  Radiology CT Renal Stone Study  Result Date: 06/13/2020 CLINICAL DATA:  Right-sided flank pain EXAM: CT ABDOMEN AND PELVIS WITHOUT CONTRAST TECHNIQUE: Multidetector CT imaging of the abdomen and pelvis was performed following the standard protocol without IV contrast. COMPARISON:  CT 09/20/2018 FINDINGS: Lower chest: No acute abnormality. Hepatobiliary: No focal liver abnormality is seen. No gallstones, gallbladder wall thickening, or biliary dilatation. Pancreas: Unremarkable. No pancreatic ductal dilatation or surrounding inflammatory changes. Spleen: Normal in size without focal abnormality. Adrenals/Urinary Tract: Adrenal glands are normal. Malrotated low lying left kidney. No significant hydronephrosis. Slightly thick-walled appearance of the urinary bladder which is not completely distended. Stomach/Bowel: Stomach is within normal limits. Appendix appears normal. No evidence of bowel wall thickening, distention, or inflammatory changes. Vascular/Lymphatic: No significant vascular findings are present. No enlarged abdominal or pelvic lymph nodes. Reproductive: Prostate appears slightly enlarged. Punctate calcifications. Other: Negative for free air or free fluid Musculoskeletal: No acute or significant osseous findings. IMPRESSION: 1. Negative for hydronephrosis or ureteral stone. Slightly thick-walled appearance of the urinary bladder which may be due to under distension, or cystitis. 2. Malrotated low lying left kidney. 3. Slightly enlarged prostate. Electronically Signed   By: Jasmine Pang M.D.   On: 06/13/2020 20:03    Procedures Procedures (including critical care time)  Medications Ordered in ED Medications  sodium chloride 0.9 % bolus 1,000 mL (1,000 mLs Intravenous New Bag/Given 06/13/20 2005)  morphine 4 MG/ML  injection 4 mg (4 mg Intravenous Given 06/13/20 2004)  ondansetron (ZOFRAN) injection 4 mg (4 mg Intravenous Given 06/13/20 2004)  metroNIDAZOLE (FLAGYL) tablet 2,000 mg (2,000 mg Oral Given 06/13/20 2056)  cefTRIAXone (ROCEPHIN) injection 500 mg (500 mg Intramuscular Given 06/13/20 2057)  lidocaine (PF) (XYLOCAINE) 1 % injection 1 mL (1 mL Other Given 06/13/20 2057)    ED Course  I have reviewed the triage vital signs and the nursing notes.  Pertinent labs & imaging results that were available during my care of the patient were reviewed by me and considered in my medical decision making (see chart for details).  Vitals:   06/13/20 2015 06/13/20 2045 06/13/20 2124 06/13/20 2130  BP:   (!) 169/103 (!) 164/105  Pulse: (!) 104 (!) 104 97 95  Resp:   18   Temp:      TempSrc:      SpO2: 99% 100% 97% 97%  Weight:      Height:          MDM Rules/Calculators/A&P                         History provided by patient with additional history obtained from chart review.    Patient presenting with right flank pain. Afebrile, tachycardic to 120 on arrival. On exam he has right flank pain and CVA tenderness.  No peritoneal signs. His heart rate during exam is 99-115. GU exam performed with chaperone present, no discharge seen, testicles are non tender and without erythema. CBC shows leukocytosis of 11.5, no anemia.  BMP shows slight bump in creatinine at 1.35.  UA suggestive of infection vs STI  as well as possible stone.  He has large blood and >50 RBC as well as nitrites, leukocytes and >50 WBC. Urine culture ordered. 1 L NS, analgesics, and antiemetic given. CT renal does not show kidney stone. He has what looks to be a UTI as he has slightly thick-walled appearance of the urinary bladder. Prostate also slightly enlarged. Suspect possible STI causing prostatitis. Will check GC as well as  RPR and HIV test.  We will treat patient prophylactically.  He received IM Rocephin while here in the department.   He will be discharged home with a prescription for 2 weeks of doxycycline.  We will hold off on adding additional antibiotics until urine culture results. Advised patient to tell partners of positive test results. On reassessment tachycardia has improved and he is pain free. Serial abdominal exams are benign. He is tolerating PO intake without difficulty.  The patient appears reasonably screened and/or stabilized for discharge and I doubt any other medical condition or other [facility] requiring further screening, evaluation, or treatment in the ED at this time prior to discharge. The patient is safe for discharge with strict return precautions discussed. Recommend pcp follow up, given resource for community health and wellness clinic.   Portions of this note were generated with Scientist, clinical (histocompatibility and immunogenetics). Dictation errors may occur despite best attempts at proofreading.     Final Clinical Impression(s) / ED Diagnoses Final diagnoses:  Urinary tract infection with hematuria, site unspecified  Concern about STD in male without diagnosis  Prostatitis, unspecified prostatitis type    Rx / DC Orders ED Discharge Orders         Ordered    doxycycline (VIBRAMYCIN) 100 MG capsule  2 times daily        06/13/20 2043           Kathyrn Lass 06/13/20 2202    Vanetta Mulders, MD 06/13/20 2211

## 2020-06-13 NOTE — Discharge Instructions (Addendum)
Prescription sent to pharmacy for doxycycline.  This is an antibiotic used to treat chlamydia as well as prostatitis.  Please take as prescribed, even if your chlamydia test result is negative you need to finish all of the pills.  You need to inform your sexual partners if your STD test results are positive so they can seek treatment.  We have given you information for [facility] and health and wellness clinic.  You can call them to try to schedule a follow-up appointment.  Return to the emergency department for any new or worsening symptoms.

## 2020-06-13 NOTE — ED Notes (Signed)
Pt to ER complaining of flank pain.  PT reports a history of kidney stones and reports that this incident feels similar.  Respirations even and unlabored.  NADN.  Will continue to monitor.

## 2020-06-13 NOTE — ED Triage Notes (Signed)
Patient states he thinks he has a kidney stone, reports for the last 3 days has had R flank pain, endorses nausea but no vomiting. Issues with keeping his stream of urination, denies pain with urination.

## 2020-06-14 LAB — RPR
RPR Ser Ql: REACTIVE — AB
RPR Titer: 1:64 {titer}

## 2020-06-14 LAB — HIV ANTIBODY (ROUTINE TESTING W REFLEX): HIV Screen 4th Generation wRfx: NONREACTIVE

## 2020-06-16 LAB — GC/CHLAMYDIA PROBE AMP (~~LOC~~) NOT AT ARMC
Chlamydia: POSITIVE — AB
Comment: NEGATIVE
Comment: NORMAL
Neisseria Gonorrhea: NEGATIVE

## 2020-06-16 LAB — URINE CULTURE: Culture: >=100000 — AB

## 2020-06-16 LAB — T.PALLIDUM AB, TOTAL: T Pallidum Abs: REACTIVE — AB

## 2020-06-17 ENCOUNTER — Telehealth: Payer: Self-pay | Admitting: Emergency Medicine

## 2020-06-17 NOTE — Progress Notes (Signed)
ED Antimicrobial Stewardship Positive Culture Follow Up   Roger Wright is an 39 y.o. male who presented to [facility] on 06/13/2020 with a chief complaint of  Chief Complaint  Patient presents with  . Flank Pain    Recent Results (from the past 720 hour(s))  Urine culture     Status: Abnormal   Collection Time: 06/13/20  6:55 PM   Specimen: Urine, Random  Result Value Ref Range Status   Specimen Description   Final    URINE, RANDOM Performed at [facility], [address] [place], [place] [address]    Special Requests   Final    NONE Performed at [facility], [address] [place], [place] [address]    Culture >=100,000 COLONIES/mL ESCHERICHIA COLI (A)  Final   Report Status 06/16/2020 FINAL  Final   Organism ID, Bacteria ESCHERICHIA COLI (A)  Final      Susceptibility   Escherichia coli - MIC*    AMPICILLIN >=32 RESISTANT Resistant     CEFAZOLIN <=4 SENSITIVE Sensitive     CEFTRIAXONE <=0.25 SENSITIVE Sensitive     CIPROFLOXACIN <=0.25 SENSITIVE Sensitive     GENTAMICIN <=1 SENSITIVE Sensitive     IMIPENEM <=0.25 SENSITIVE Sensitive     NITROFURANTOIN <=16 SENSITIVE Sensitive     TRIMETH/SULFA >=320 RESISTANT Resistant     AMPICILLIN/SULBACTAM 4 SENSITIVE Sensitive     PIP/TAZO <=4 SENSITIVE Sensitive     * >=100,000 COLONIES/mL ESCHERICHIA COLI    Continue treatment with doxycycline for 14 days to treat chlamydia, syphilis, and prostatitis. No treatment necessary for E. Coli in urine.  ED Provider: Fayrene Helper, PA-C   Lucina Mellow, PharmD Candidate 06/17/2020, 9:55 AM

## 2020-06-17 NOTE — Telephone Encounter (Signed)
Post ED Visit - Positive Culture Follow-up  Culture report reviewed by antimicrobial stewardship pharmacist: [facility] []  , Pharm.D. []  Enzo Bi, Pharm.D., BCPS AQ-ID []  , Pharm.D., BCPS []  Celedonio Miyamoto, Pharm.D., BCPS []  [place], Garvin Fila.D., BCPS, AAHIVP []  , Pharm.D., BCPS, AAHIVP []  Georgina Pillion, PharmD, BCPS []  , PharmD, BCPS []  [place], PharmD, BCPS []  [address], PharmD []  , PharmD, BCPS []  Estella Husk, PharmD  Pharmacy Team []  Lysle Pearl, PharmD []  , PharmD []  Phillips Climes, PharmD []  , Rph []  Agapito Games) , PharmD []  Verlan Friends, PharmD []  , PharmD []  Mervyn Gay, PharmD []  , PharmD []  Vinnie Level, PharmD []  [facility], PharmD []  , PharmD []  Len Childs, PharmD   Positive urine culture Treated with doxycycline, organism sensitive to the same and no further patient follow-up is required at this time.  06/17/2020, 10:06 AM

## 2021-04-01 ENCOUNTER — Other Ambulatory Visit: Payer: Self-pay

## 2021-04-01 ENCOUNTER — Inpatient Hospital Stay (HOSPITAL_COMMUNITY)
Admission: EM | Admit: 2021-04-01 | Discharge: 2021-04-03 | DRG: 153 | Disposition: A | Discharge status: Home / Self Care | Payer: Self-pay | Attending: Internal Medicine | Admitting: Internal Medicine

## 2021-04-01 ENCOUNTER — Encounter (HOSPITAL_COMMUNITY): Payer: Self-pay | Admitting: Emergency Medicine

## 2021-04-01 ENCOUNTER — Emergency Department (HOSPITAL_COMMUNITY): Payer: Self-pay

## 2021-04-01 DIAGNOSIS — Z20822 Contact with and (suspected) exposure to covid-19: Secondary | ICD-10-CM | POA: Diagnosis present

## 2021-04-01 DIAGNOSIS — F17210 Nicotine dependence, cigarettes, uncomplicated: Secondary | ICD-10-CM | POA: Diagnosis present

## 2021-04-01 DIAGNOSIS — J039 Acute tonsillitis, unspecified: Secondary | ICD-10-CM | POA: Diagnosis present

## 2021-04-01 DIAGNOSIS — I160 Hypertensive urgency: Secondary | ICD-10-CM | POA: Diagnosis not present

## 2021-04-01 DIAGNOSIS — Z7251 High risk heterosexual behavior: Secondary | ICD-10-CM

## 2021-04-01 DIAGNOSIS — I10 Essential (primary) hypertension: Secondary | ICD-10-CM | POA: Diagnosis present

## 2021-04-01 DIAGNOSIS — A749 Chlamydial infection, unspecified: Secondary | ICD-10-CM | POA: Diagnosis present

## 2021-04-01 DIAGNOSIS — M79645 Pain in left finger(s): Secondary | ICD-10-CM | POA: Diagnosis present

## 2021-04-01 DIAGNOSIS — M79646 Pain in unspecified finger(s): Secondary | ICD-10-CM

## 2021-04-01 DIAGNOSIS — Z87898 Personal history of other specified conditions: Secondary | ICD-10-CM

## 2021-04-01 DIAGNOSIS — R590 Localized enlarged lymph nodes: Secondary | ICD-10-CM | POA: Diagnosis present

## 2021-04-01 DIAGNOSIS — Z716 Tobacco abuse counseling: Secondary | ICD-10-CM

## 2021-04-01 DIAGNOSIS — J029 Acute pharyngitis, unspecified: Secondary | ICD-10-CM | POA: Diagnosis present

## 2021-04-01 DIAGNOSIS — L080 Pyoderma: Secondary | ICD-10-CM | POA: Diagnosis present

## 2021-04-01 DIAGNOSIS — J0510 Acute epiglottitis without obstruction: Principal | ICD-10-CM | POA: Diagnosis present

## 2021-04-01 DIAGNOSIS — B169 Acute hepatitis B without delta-agent and without hepatic coma: Secondary | ICD-10-CM | POA: Diagnosis present

## 2021-04-01 LAB — CBC WITH DIFFERENTIAL/PLATELET
Abs Immature Granulocytes: 0.03 10*3/uL (ref 0.00–0.07)
Basophils Absolute: 0.1 10*3/uL (ref 0.0–0.1)
Basophils Relative: 1 %
Eosinophils Absolute: 0.3 10*3/uL (ref 0.0–0.5)
Eosinophils Relative: 4 %
HCT: 38.1 % — ABNORMAL LOW (ref 39.0–52.0)
Hemoglobin: 12.2 g/dL — ABNORMAL LOW (ref 13.0–17.0)
Immature Granulocytes: 0 %
Lymphocytes Relative: 30 %
Lymphs Abs: 2.2 10*3/uL (ref 0.7–4.0)
MCH: 28.4 pg (ref 26.0–34.0)
MCHC: 32 g/dL (ref 30.0–36.0)
MCV: 88.6 fL (ref 80.0–100.0)
Monocytes Absolute: 0.7 10*3/uL (ref 0.1–1.0)
Monocytes Relative: 9 %
Neutro Abs: 4.1 10*3/uL (ref 1.7–7.7)
Neutrophils Relative %: 56 %
Platelets: 257 10*3/uL (ref 150–400)
RBC: 4.3 MIL/uL (ref 4.22–5.81)
RDW: 14 % (ref 11.5–15.5)
WBC: 7.3 10*3/uL (ref 4.0–10.5)
nRBC: 0 % (ref 0.0–0.2)

## 2021-04-01 LAB — RESP PANEL BY RT-PCR (FLU A&B, COVID) ARPGX2
Influenza A by PCR: NEGATIVE
Influenza B by PCR: NEGATIVE
SARS Coronavirus 2 by RT PCR: NEGATIVE

## 2021-04-01 LAB — COMPREHENSIVE METABOLIC PANEL
ALT: 19 U/L (ref 0–44)
AST: 25 U/L (ref 15–41)
Albumin: 3.6 g/dL (ref 3.5–5.0)
Alkaline Phosphatase: 78 U/L (ref 38–126)
Anion gap: 7 (ref 5–15)
BUN: 13 mg/dL (ref 6–20)
CO2: 26 mmol/L (ref 22–32)
Calcium: 8.9 mg/dL (ref 8.9–10.3)
Chloride: 105 mmol/L (ref 98–111)
Creatinine, Ser: 1.05 mg/dL (ref 0.61–1.24)
GFR, Estimated: >60 mL/min (ref >60)
Glucose, Bld: 101 mg/dL — ABNORMAL HIGH (ref 70–99)
Potassium: 4.3 mmol/L (ref 3.5–5.1)
Sodium: 138 mmol/L (ref 135–145)
Total Bilirubin: 0.6 mg/dL (ref 0.3–1.2)
Total Protein: 7.4 g/dL (ref 6.5–8.1)

## 2021-04-01 LAB — GROUP A STREP BY PCR: Group A Strep by PCR: NOT DETECTED

## 2021-04-01 MED ORDER — LIDOCAINE VISCOUS HCL 2 % MT SOLN
15.0000 mL | Freq: Once | OROMUCOSAL | Status: AC
  Administered 2021-04-01: 15 mL via OROMUCOSAL
  Filled 2021-04-01: qty 15

## 2021-04-01 MED ORDER — KETOROLAC TROMETHAMINE 15 MG/ML IJ SOLN
15.0000 mg | Freq: Four times a day (QID) | INTRAMUSCULAR | Status: DC | PRN
  Administered 2021-04-03: 15 mg via INTRAVENOUS
  Filled 2021-04-01: qty 1

## 2021-04-01 MED ORDER — DEXAMETHASONE SODIUM PHOSPHATE 10 MG/ML IJ SOLN
16.0000 mg | Freq: Once | INTRAMUSCULAR | Status: AC
  Administered 2021-04-01: 16 mg via INTRAVENOUS
  Filled 2021-04-01: qty 2

## 2021-04-01 MED ORDER — NICOTINE 7 MG/24HR TD PT24
7.0000 mg | MEDICATED_PATCH | Freq: Every day | TRANSDERMAL | Status: DC
  Administered 2021-04-01: 7 mg via TRANSDERMAL
  Filled 2021-04-01 (×2): qty 1

## 2021-04-01 MED ORDER — ONDANSETRON HCL 4 MG/2ML IJ SOLN
4.0000 mg | Freq: Once | INTRAMUSCULAR | Status: AC
  Administered 2021-04-02: 4 mg via INTRAVENOUS
  Filled 2021-04-01: qty 2

## 2021-04-01 MED ORDER — PHENOL 1.4 % MT LIQD
1.0000 | OROMUCOSAL | Status: DC | PRN
  Filled 2021-04-01: qty 177

## 2021-04-01 MED ORDER — ENOXAPARIN SODIUM 40 MG/0.4ML IJ SOSY
40.0000 mg | PREFILLED_SYRINGE | INTRAMUSCULAR | Status: DC
  Administered 2021-04-01 – 2021-04-02 (×2): 40 mg via SUBCUTANEOUS
  Filled 2021-04-01 (×2): qty 0.4

## 2021-04-01 MED ORDER — SODIUM CHLORIDE 0.9 % IV SOLN: INTRAVENOUS | Status: DC

## 2021-04-01 MED ORDER — ALBUTEROL SULFATE (2.5 MG/3ML) 0.083% IN NEBU: 2.5000 mg | INHALATION_SOLUTION | RESPIRATORY_TRACT | Status: DC | PRN

## 2021-04-01 MED ORDER — KETOROLAC TROMETHAMINE 30 MG/ML IJ SOLN
30.0000 mg | Freq: Once | INTRAMUSCULAR | Status: AC
  Administered 2021-04-01: 30 mg via INTRAVENOUS
  Filled 2021-04-01: qty 1

## 2021-04-01 MED ORDER — SODIUM CHLORIDE 0.9 % IV SOLN
2.0000 g | INTRAVENOUS | Status: DC
  Administered 2021-04-01: 2 g via INTRAVENOUS
  Filled 2021-04-01 (×2): qty 20

## 2021-04-01 MED ORDER — CLINDAMYCIN PHOSPHATE 600 MG/50ML IV SOLN
600.0000 mg | Freq: Once | INTRAVENOUS | Status: AC
  Administered 2021-04-01: 600 mg via INTRAVENOUS
  Filled 2021-04-01: qty 50

## 2021-04-01 MED ORDER — IOHEXOL 350 MG/ML SOLN
100.0000 mL | Freq: Once | INTRAVENOUS | Status: AC | PRN
  Administered 2021-04-01: 75 mL via INTRAVENOUS

## 2021-04-01 MED ORDER — HYDRALAZINE HCL 20 MG/ML IJ SOLN
10.0000 mg | INTRAMUSCULAR | Status: DC | PRN
  Administered 2021-04-01 – 2021-04-02 (×2): 10 mg via INTRAVENOUS
  Filled 2021-04-01 (×2): qty 1

## 2021-04-01 MED ORDER — OXYCODONE HCL 5 MG PO TABS
5.0000 mg | ORAL_TABLET | ORAL | Status: DC | PRN
  Administered 2021-04-02 – 2021-04-03 (×6): 5 mg via ORAL
  Filled 2021-04-01 (×6): qty 1

## 2021-04-01 MED ORDER — IBUPROFEN 200 MG PO TABS: 400.0000 mg | ORAL_TABLET | Freq: Four times a day (QID) | ORAL | Status: DC | PRN

## 2021-04-01 MED ORDER — MORPHINE SULFATE (PF) 2 MG/ML IV SOLN: 2.0000 mg | Freq: Once | INTRAVENOUS | Status: DC

## 2021-04-01 MED ORDER — SODIUM CHLORIDE 0.9% FLUSH
3.0000 mL | Freq: Two times a day (BID) | INTRAVENOUS | Status: DC
  Administered 2021-04-02: 3 mL via INTRAVENOUS

## 2021-04-01 MED ORDER — DEXAMETHASONE SODIUM PHOSPHATE 4 MG/ML IJ SOLN
4.0000 mg | Freq: Three times a day (TID) | INTRAMUSCULAR | Status: AC
  Administered 2021-04-01 – 2021-04-02 (×3): 4 mg via INTRAVENOUS
  Filled 2021-04-01 (×3): qty 1

## 2021-04-01 NOTE — H&P (Addendum)
History and Physical    Roger Wright WFU:[id number] DOB: Jul 13, 1981 DOA: 04/01/2021  PCP: Patient, No Pcp Per (Inactive)   I have briefly reviewed patients previous medical reports in [facility].  Patient coming from: Home  Chief Complaint: Throat pain  HPI: Roger Wright is a 40 year old male, denies any past medical history, tobacco use disorder, alcohol use, THC use, EDP notes documents acute hepatitis B and alcohol intoxication, presents to the ED with complaints of throat pain.  Patient was in usual state of health until approximately 2 days ago when he started having URI symptoms including dry cough and nasal congestion.  He thought this was related to allergy from trees outside his house.  No sickly contacts with similar complaints.  1 day prior to ED arrival however symptoms got progressively worse with throat pain, difficulty swallowing, some change in voice and dyspnea but no noisy breathing.  No drooling of saliva.  He reported to EDP that his tongue felt swollen but did not to me.  Tylenol and Advil did not help.  Denies prior such episodes.  Says he is homosexual and does practice oral sex.  ED Course: Afebrile, no leukocytosis, CT neck with significant findings as below.  EDP discussed with ENT on-call Dr. Dillard Cannon whose recommendations were for oral clindamycin, steroid Dosepak and follow-up with him as outpatient.  However due to epiglottic swelling, EDP was concerned about acute airway decompensation and recommended hospital admission and close observation.  Patient received a dose of IV clindamycin and Decadron.  Review of Systems:  All other systems reviewed and apart from HPI, are negative.  Past Medical History:  Diagnosis Date   Alcohol intoxication delirium (HCC) 12/30/2014   Kidney stone     Past Surgical History:  Procedure Laterality Date   HERNIA REPAIR      Social History  reports that he has been smoking. He has never used smokeless  tobacco. He reports that he does not currently use alcohol. He reports current drug use. Drug: Marijuana.  No Known Allergies  No family history on file.   Prior to Admission medications   Not on File    Physical Exam: Vitals:   04/01/21 1024 04/01/21 1025 04/01/21 1200 04/01/21 1317  BP:  (!) 145/86 (!) 152/92 (!) 154/96  Pulse: 80 97 98 96  Resp:  19 18 17   Temp:      TempSrc:      SpO2: 100% 100% 100% 99%      Constitutional: Pleasant young male, well-built and nourished, lying comfortably supine in bed without distress.  Does not look septic or toxic. Eyes: PERTLA, lids and conjunctivae normal ENMT: Mucous membranes are somewhat dry without oral thrush.  Difficult to clearly visualize posterior pharyngeal wall, posterior tongue obstructing view.  Able to visualize uvula with some swelling, patchy erythema of the posterior pharyngeal wall, whitish tonsillar exudate, mostly on the left but visualized on the right as well by EDP.  Per EDP exam, 2+ swelling of tonsils bilaterally with significant white exudate and erythema, uvula swollen but midline.  Minimally muffled voice.  No trismus.  No tongue edema. Neck: Extensive bilateral cervical lymphadenopathy of varying sizes, more so on the left. Respiratory: Clear to auscultation without wheezing, rhonchi or crackles. No increased work of breathing. Cardiovascular: S1 & S2 heard, regular rate and rhythm. No JVD, murmurs, rubs or clicks. No pedal edema. Abdomen: Non distended. Non tender. Soft. No organomegaly or masses appreciated. No clinical Ascites. Normal bowel sounds heard.  Musculoskeletal: no clubbing / cyanosis. No joint deformity upper and lower extremities. Good ROM, no contractures. Normal muscle tone.  Skin: no rashes, lesions, ulcers. No induration Neurologic: CN 2-12 grossly intact. Sensation intact, DTR normal. Strength 5/5 in all 4 limbs.  Psychiatric: Normal judgment and insight. Alert and oriented x 3. Normal mood.      Labs on Admission: I have personally reviewed following labs and imaging studies  CBC: Recent Labs  Lab 04/01/21 0955  WBC 7.3  NEUTROABS 4.1  HGB 12.2*  HCT 38.1*  MCV 88.6  PLT 257    Basic Metabolic Panel: Recent Labs  Lab 04/01/21 0955  NA 138  K 4.3  CL 105  CO2 26  GLUCOSE 101*  BUN 13  CREATININE 1.05  CALCIUM 8.9    Liver Function Tests: Recent Labs  Lab 04/01/21 0955  AST 25  ALT 19  ALKPHOS 78  BILITOT 0.6  PROT 7.4  ALBUMIN 3.6    Urine analysis:    Component Value Date/Time   COLORURINE YELLOW 06/13/2020 1855   APPEARANCEUR CLOUDY (A) 06/13/2020 1855   LABSPEC 1.012 06/13/2020 1855   PHURINE 6.0 06/13/2020 1855   GLUCOSEU NEGATIVE 06/13/2020 1855   HGBUR LARGE (A) 06/13/2020 1855   BILIRUBINUR NEGATIVE 06/13/2020 1855   KETONESUR NEGATIVE 06/13/2020 1855   PROTEINUR 100 (A) 06/13/2020 1855   UROBILINOGEN 1.0 12/30/2014 0720   NITRITE POSITIVE (A) 06/13/2020 1855   LEUKOCYTESUR LARGE (A) 06/13/2020 1855     Radiological Exams on Admission: CT Soft Tissue Neck W Contrast  Result Date: 04/01/2021 CLINICAL DATA:  Throat swelling/tonsillitis EXAM: CT NECK WITH CONTRAST TECHNIQUE: Multidetector CT imaging of the neck was performed using the standard protocol following the bolus administration of intravenous contrast. CONTRAST:  52mL OMNIPAQUE IOHEXOL 350 MG/ML SOLN COMPARISON:  None. FINDINGS: Pharynx and larynx: Prominence of the palatine tonsils and uvula. Thickening of the epiglottis. Submucosal edema in the oropharyngeal wall. No evidence of abscess. Airway is patent. Salivary glands: Parotid and submandibular glands are unremarkable. Thyroid: Normal. Lymph nodes: Increased number of bilateral nonenlarged and enlarged lymph nodes at all stations. For example, enlarged right level 2 node measuring 2.1 cm and enlarged left level 2 node measuring 2.4 cm both on series 2, image 51. Vascular: Major neck vessels are patent. Limited  intracranial: No abnormal enhancement. Visualized orbits: Unremarkable. Mastoids and visualized paranasal sinuses: Minor mucosal thickening. Mastoid air cells are clear. Skeleton: Minimal cervical spine degenerative changes. Upper chest: Included lungs are clear. Other: Soft tissue edema. IMPRESSION: Oropharyngeal wall thickening/edema and mild thickening of the epiglottis. No evidence of abscess. Bilateral adenopathy. Airway is patent. Electronically Signed   By: Guadlupe Spanish M.D.   On: 04/01/2021 12:26      Assessment/Plan Principal Problem:   Pharyngitis Active Problems:   Epiglottitis     Acute pharyngitis, tonsillitis and epiglottitis: Etiology could be viral versus bacterial versus other etiologies.  Hemodynamically stable, not clinically septic at this time and no features of airway compromise but needs to be monitored closely for same.  CT neck findings as noted above.  COVID-19 and influenza panel PCR negative.  Group A strep by PCR negative.  Upon chart review, had positive chlamydia, reactive Treponema pallidum antibodies, RPR with an RPR titer of 1.64, negative HIV screen on 06/13/2020. I discussed in detail with infectious disease MD on-call Dr. Renold Don who recommended IV ceftriaxone, recommended getting HIV screen, RPR, throat/urine/rectal chlamydia testing and he will follow-up on results and make recommendations as needed.  I discussed with Dr. Dillard Cannon, ENT who is agreeable for the IV ceftriaxone & brief steroids for inflammation.  Could consider outpatient follow-up with ENT and ID depending on how he does at time of discharge.  Pain management.  Tobacco use disorder: Cessation counseled.  Request nicotine patch-placed.  THC use disorder: Cessation counseled.  Alcohol use: Reports doing occasionally every couple of weeks.  Cervical lymphadenopathy: In a homosexual male with history of oral sex.  Evaluation as noted above.  Could be related to his  pharyngitis/tonsillitis/epiglottitis versus other etiologies.  Follow-up until clinical resolution.  Outpatient follow-up with PCP.  History of acute hepatitis B: Unclear if he was treated for same.  Outpatient follow-up.  DVT prophylaxis: Lovenox Code Status: Full Family Communication: None at bedside Disposition Plan:   Patient is from:  Home  Anticipated DC to:  Home  Anticipated DC date:  1 to 2 days  Anticipated DC barriers: None   Consults called: Discussed with ENT and infectious disease Admission status: Observation  Severity of Illness: The appropriate patient status for this patient is OBSERVATION. Observation status is judged to be reasonable and necessary in order to provide the required intensity of service to ensure the patient's safety. The patient's presenting symptoms, physical exam findings, and initial radiographic and laboratory data in the context of their medical condition is felt to place them at decreased risk for further clinical deterioration. Furthermore, it is anticipated that the patient will be medically stable for discharge from the hospital within 2 midnights of admission. The following factors support the patient status of observation.   " The patient's presenting symptoms include throat pain. " The physical exam findings include uvula, tonsillar swelling, posterior pharyngeal/tonsillar exudates, diffuse cervical lymphadenopathy. " The initial radiographic and laboratory data are CT neck showed oropharyngeal wall thickening/edema and mild thickening of the epiglottis.  No evidence of abscess.  Bilateral adenopathy.  Airway patent.    Marcellus Scott MD [facility]  To contact the attending provider between 7A-7P or the covering provider during after hours 7P-7A, please log into the web site [email or web address] and access using universal [place] password for that web site. If you do not have the password, please call the hospital  operator.  04/01/2021, 2:34 PM

## 2021-04-01 NOTE — ED Provider Notes (Signed)
[facility]-EMERGENCY DEPT Provider Note   CSN: [id number] Arrival date & time: 04/01/21  0857     History Chief Complaint  Patient presents with   Sore Throat    Roger Wright is a 40 y.o. male.  HPI 40 year old male with a history of acute hepatitis B, alcohol intoxication presents to the ER with complaints of sore throat, difficulty swallowing, voice change which has been ongoing since last night.  Patient also reports URI type symptoms with nasal congestion and cough over the last several days.  Last night started to develop sore throat.  He is having difficulty characterizing his symptoms as he states that he is "freaking out about it".  He also states he feels like his tongue is swollen.  He denies any history of allergies or anaphylactic reaction, no recent medication changes, no new foods.  No other sick contacts.    Past Medical History:  Diagnosis Date   Alcohol intoxication delirium (HCC) 12/30/2014   Kidney stone     Patient Active Problem List   Diagnosis Date Noted   Pharyngitis 04/01/2021   Acute hepatitis B    Transaminitis 12/30/2014   Acute hepatitis 12/30/2014   Alcohol intoxication (HCC)     Past Surgical History:  Procedure Laterality Date   HERNIA REPAIR         No family history on file.  Social History   Tobacco Use   Smoking status: Every Day   Smokeless tobacco: Never  Substance Use Topics   Alcohol use: Not Currently    Comment: my drinking is inconsistant, It used to be very heavy  "   Drug use: No    Home Medications Prior to Admission medications   Not on File    Allergies    Patient has no known allergies.  Review of Systems   Review of Systems Ten systems reviewed and are negative for acute change, except as noted in the HPI.   Physical Exam Updated Vital Signs BP (!) 154/96   Pulse 96   Temp 98.6 F (37 C) (Oral)   Resp 17   SpO2 99%   Physical Exam Vitals and nursing note reviewed.   Constitutional:      Appearance: He is well-developed.  HENT:     Head: Normocephalic and atraumatic.     Mouth/Throat:     Pharynx: Posterior oropharyngeal erythema and uvula swelling present.     Tonsils: Tonsillar exudate present. 2+ on the right. 2+ on the left.     Comments: 2+ swelling to tonsils bilaterally with significant white exudate and erythema, uvula swollen but midline.  No tripoding.  He does have a slightly muffled voice.  Mild cervical lymph node swelling.  No evidence of angioedema or tongue swelling, no submandibular swelling Eyes:     Conjunctiva/sclera: Conjunctivae normal.  Cardiovascular:     Rate and Rhythm: Normal rate and regular rhythm.     Heart sounds: No murmur heard. Pulmonary:     Effort: Pulmonary effort is normal. No respiratory distress.     Breath sounds: Normal breath sounds.  Abdominal:     Palpations: Abdomen is soft.     Tenderness: There is no abdominal tenderness.  Musculoskeletal:     Cervical back: Neck supple.  Skin:    General: Skin is warm and dry.  Neurological:     Mental Status: He is alert.    ED Results / Procedures / Treatments   Labs (all labs ordered are listed,  but only abnormal results are displayed) Labs Reviewed  CBC WITH DIFFERENTIAL/PLATELET - Abnormal; Notable for the following components:      Result Value   Hemoglobin 12.2 (*)    HCT 38.1 (*)    All other components within normal limits  COMPREHENSIVE METABOLIC PANEL - Abnormal; Notable for the following components:   Glucose, Bld 101 (*)    All other components within normal limits  GROUP A STREP BY PCR  RESP PANEL BY RT-PCR (FLU A&B, COVID) [id number]  CULTURE, GROUP A STREP [facility]    EKG None  Radiology CT Soft Tissue Neck W Contrast  Result Date: 04/01/2021 CLINICAL DATA:  Throat swelling/tonsillitis EXAM: CT NECK WITH CONTRAST TECHNIQUE: Multidetector CT imaging of the neck was performed using the standard protocol following the bolus administration  of intravenous contrast. CONTRAST:  82mL OMNIPAQUE IOHEXOL 350 MG/ML SOLN COMPARISON:  None. FINDINGS: Pharynx and larynx: Prominence of the palatine tonsils and uvula. Thickening of the epiglottis. Submucosal edema in the oropharyngeal wall. No evidence of abscess. Airway is patent. Salivary glands: Parotid and submandibular glands are unremarkable. Thyroid: Normal. Lymph nodes: Increased number of bilateral nonenlarged and enlarged lymph nodes at all stations. For example, enlarged right level 2 node measuring 2.1 cm and enlarged left level 2 node measuring 2.4 cm both on series 2, image 51. Vascular: Major neck vessels are patent. Limited intracranial: No abnormal enhancement. Visualized orbits: Unremarkable. Mastoids and visualized paranasal sinuses: Minor mucosal thickening. Mastoid air cells are clear. Skeleton: Minimal cervical spine degenerative changes. Upper chest: Included lungs are clear. Other: Soft tissue edema. IMPRESSION: Oropharyngeal wall thickening/edema and mild thickening of the epiglottis. No evidence of abscess. Bilateral adenopathy. Airway is patent. Electronically Signed   By: Guadlupe Spanish M.D.   On: 04/01/2021 12:26    Procedures Procedures   Medications Ordered in ED Medications  dexamethasone (DECADRON) injection 16 mg (16 mg Intravenous Given 04/01/21 0947)  lidocaine (XYLOCAINE) 2 % viscous mouth solution 15 mL (15 mLs Mouth/Throat Given 04/01/21 0953)  clindamycin (CLEOCIN) IVPB 600 mg (0 mg Intravenous Stopped 04/01/21 1020)  ketorolac (TORADOL) 30 MG/ML injection 30 mg (30 mg Intravenous Given 04/01/21 0947)  iohexol (OMNIPAQUE) 350 MG/ML injection 100 mL (75 mLs Intravenous Contrast Given 04/01/21 1156)    ED Course  I have reviewed the triage vital signs and the nursing notes.  Pertinent labs & imaging results that were available during my care of the patient were reviewed by me and considered in my medical decision making (see chart for details).    MDM  Rules/Calculators/A&P                           40 year old male who presents to the ER with complaints of sore throat, URI type symptoms.  On arrival, he is anxious appearing, however no tripoding, drooling, speaking full sentences without increased work of breathing.  Able to swallow without difficulty.  On arrival, he is slightly hypertensive, however afebrile, not tachycardic, tachypneic or hypoxic.  Physical exam was significant bilateral tonsillar swelling with white exudate and slight uvula swelling but midline.  No submandibular/sublingual swelling.  DDx includes viral/bacterial pharyngitis, RPA, PTA, candidiasis  CBC without leukocytosis, CMP large unremarkable.  COVID and flu are negative.  Strep is negative.  Plan for CT of the soft tissues neck to rule out abscess.  Patient received clindamycin, Toradol, Decadron and viscous lidocaine. COVID and flu are negative  CT soft tissue neck w/ followings findings:  IMPRESSION: Oropharyngeal wall thickening/edema and mild thickening of the epiglottis. No evidence of abscess. Bilateral adenopathy. Airway is patent.    Spoke w/ Dr. Ezzard Standing, who reviewed CT imaging. Pt w/ significant edema and pus accumulation. We discussed the option of continuing clindamycin therapy,  giving a steroid dosepack and following up outpatient. Case also discussed w/ Dr. Dalene Seltzer, supervising physician. Given epiglotic swelling, there is concern for acute airway decompensation and we do believe the patient would benefit from admission for continuous IV antibiotics, steroids, and close observation. Patient is agreeable to this. Consulted hospitalist team   Hospitalist team will admit for admission. Remains hemodynamically stable  Final Clinical Impression(s) / ED Diagnoses Final diagnoses:  Epiglottitis    Rx / DC Orders ED Discharge Orders     None        Leone Brand 04/01/21 1321    Alvira Monday, MD 04/03/21 [phone]

## 2021-04-01 NOTE — ED Notes (Signed)
Patient informed of need for urine sample, will inform staff when he is able to provide a specimen.

## 2021-04-01 NOTE — ED Triage Notes (Signed)
Patient c/o coughing and sneezing after being outside yesterday. Reports waking this morning with sore throat and swelling. Maintaining saliva during triage.

## 2021-04-01 NOTE — ED Notes (Signed)
ED TO INPATIENT HANDOFF REPORT  Name/Age/Gender Roger Wright 40 y.o. male  Code Status    Code Status Orders  (From admission, onward)         Start     Ordered   04/01/21 1848  Full code  Continuous        04/01/21 1847        Code Status History    Date Active Date Inactive Code Status Order ID Comments User Context   12/30/2014 1532 12/31/2014 2151 Full Code [id number]  Courtney Paris, MD Inpatient      Home/SNF/Other Home  Chief Complaint Pharyngitis [J02.9]  Level of Care/Admitting Diagnosis ED Disposition    ED Disposition  Admit   Condition  --   Comment  Hospital Area: [facility] [[id number]]  Level of Care: Telemetry [5]  Admit to tele based on following criteria: Other see comments  Comments: Monitor closely for airway compromise  May place patient in observation at [facility] or [facility] if equivalent level of care is available:: No  Covid Evaluation: Confirmed COVID Negative  Diagnosis: Pharyngitis [163846]  Admitting Physician: Elease Etienne [[id number]]  Attending Physician: Marcellus Scott D [[id number]]         Medical History Past Medical History:  Diagnosis Date  . Alcohol intoxication delirium (HCC) 12/30/2014  . Kidney stone     Allergies No Known Allergies  IV Location/Drains/Wounds Patient Lines/Drains/Airways Status    Active Line/Drains/Airways    Name Placement date Placement time Site Days   Peripheral IV 04/01/21 20 G Left Antecubital 04/01/21  0947  Antecubital  less than 1          Labs/Imaging Results for orders placed or performed during the hospital encounter of 04/01/21 (from the past 48 hour(s))  CBC with Differential     Status: Abnormal   Collection Time: 04/01/21  9:55 AM  Result Value Ref Range   WBC 7.3 4.0 - 10.5 K/uL   RBC 4.30 4.22 - 5.81 MIL/uL   Hemoglobin 12.2 (L) 13.0 - 17.0 g/dL   HCT 65.9 (L) 93.5 - 70.1 %   MCV 88.6 80.0 - 100.0 fL   MCH 28.4 26.0 - 34.0 pg   MCHC 32.0 30.0 -  36.0 g/dL   RDW 77.9 39.0 - 30.0 %   Platelets 257 150 - 400 K/uL   nRBC 0.0 0.0 - 0.2 %   Neutrophils Relative % 56 %   Neutro Abs 4.1 1.7 - 7.7 K/uL   Lymphocytes Relative 30 %   Lymphs Abs 2.2 0.7 - 4.0 K/uL   Monocytes Relative 9 %   Monocytes Absolute 0.7 0.1 - 1.0 K/uL   Eosinophils Relative 4 %   Eosinophils Absolute 0.3 0.0 - 0.5 K/uL   Basophils Relative 1 %   Basophils Absolute 0.1 0.0 - 0.1 K/uL   Immature Granulocytes 0 %   Abs Immature Granulocytes 0.03 0.00 - 0.07 K/uL   Reactive, Benign Lymphocytes PRESENT     Comment: Performed at [facility], [address] [place], [place] [address]  Comprehensive metabolic panel     Status: Abnormal   Collection Time: 04/01/21  9:55 AM  Result Value Ref Range   Sodium 138 135 - 145 mmol/L   Potassium 4.3 3.5 - 5.1 mmol/L   Chloride 105 98 - 111 mmol/L   CO2 26 22 - 32 mmol/L   Glucose, Bld 101 (H) 70 - 99 mg/dL    Comment: Glucose reference  range applies only to samples taken after fasting for at least 8 hours.   BUN 13 6 - 20 mg/dL   Creatinine, Ser 1.01 0.61 - 1.24 mg/dL   Calcium 8.9 8.9 - 75.1 mg/dL   Total Protein 7.4 6.5 - 8.1 g/dL   Albumin 3.6 3.5 - 5.0 g/dL   AST 25 15 - 41 U/L   ALT 19 0 - 44 U/L   Alkaline Phosphatase 78 38 - 126 U/L   Total Bilirubin 0.6 0.3 - 1.2 mg/dL   GFR, Estimated >02 >58 mL/min    Comment: (NOTE) Calculated using the CKD-EPI Creatinine Equation (2021)    Anion gap 7 5 - 15    Comment: Performed at [facility], [address], [place], [place] [address]  Group A Strep by PCR     Status: None   Collection Time: 04/01/21  9:55 AM   Specimen: Sterile Swab  Result Value Ref Range   Group A Strep by PCR NOT DETECTED NOT DETECTED    Comment: Performed at [facility], [address], [place], [place] [address]  Resp Panel by RT-PCR (Flu A&B, Covid)     Status: None   Collection Time: 04/01/21  9:55 AM   Specimen:  Nasopharyngeal(NP) swabs in vial transport medium  Result Value Ref Range   SARS Coronavirus 2 by RT PCR NEGATIVE NEGATIVE    Comment: (NOTE) SARS-CoV-2 target nucleic acids are NOT DETECTED.  The SARS-CoV-2 RNA is generally detectable in upper respiratory specimens during the acute phase of infection. The lowest concentration of SARS-CoV-2 viral copies this assay can detect is 138 copies/mL. A negative result does not preclude SARS-Cov-2 infection and should not be used as the sole basis for treatment or other patient management decisions. A negative result may occur with  improper specimen collection/handling, submission of specimen other than nasopharyngeal swab, presence of viral mutation(s) within the areas targeted by this assay, and inadequate number of viral copies(<138 copies/mL). A negative result must be combined with clinical observations, patient history, and epidemiological information. The expected result is Negative.  Fact Sheet for Patients:  BloggerCourse.com  Fact Sheet for Healthcare Providers:  SeriousBroker.it  This test is no t yet approved or cleared by the [place] FDA and  has been authorized for detection and/or diagnosis of SARS-CoV-2 by FDA under an Emergency Use Authorization (EUA). This EUA will remain  in effect (meaning this test can be used) for the duration of the COVID-19 declaration under Section 564(b)(1) of the Act, 21 U.S.C.section 360bbb-3(b)(1), unless the authorization is terminated  or revoked sooner.       Influenza A by PCR NEGATIVE NEGATIVE   Influenza B by PCR NEGATIVE NEGATIVE    Comment: (NOTE) The Xpert Xpress SARS-CoV-2/FLU/RSV plus assay is intended as an aid in the diagnosis of influenza from Nasopharyngeal swab specimens and should not be used as a sole basis for treatment. Nasal washings and aspirates are unacceptable for Xpert Xpress  SARS-CoV-2/FLU/RSV testing.  Fact Sheet for Patients: BloggerCourse.com  Fact Sheet for Healthcare Providers: SeriousBroker.it  This test is not yet approved or cleared by the [place] FDA and has been authorized for detection and/or diagnosis of SARS-CoV-2 by FDA under an Emergency Use Authorization (EUA). This EUA will remain in effect (meaning this test can be used) for the duration of the COVID-19 declaration under Section 564(b)(1) of the Act, 21 U.S.C. section 360bbb-3(b)(1), unless the authorization is terminated or revoked.  Performed at [facility], [address], [place], [place] [address]    CT Soft Tissue Neck W Contrast  Result Date: 04/01/2021 CLINICAL DATA:  Throat swelling/tonsillitis EXAM: CT NECK WITH CONTRAST TECHNIQUE: Multidetector CT imaging of the neck was performed using the standard protocol following the bolus administration of intravenous contrast. CONTRAST:  27mL OMNIPAQUE IOHEXOL 350 MG/ML SOLN COMPARISON:  None. FINDINGS: Pharynx and larynx: Prominence of the palatine tonsils and uvula. Thickening of the epiglottis. Submucosal edema in the oropharyngeal wall. No evidence of abscess. Airway is patent. Salivary glands: Parotid and submandibular glands are unremarkable. Thyroid: Normal. Lymph nodes: Increased number of bilateral nonenlarged and enlarged lymph nodes at all stations. For example, enlarged right level 2 node measuring 2.1 cm and enlarged left level 2 node measuring 2.4 cm both on series 2, image 51. Vascular: Major neck vessels are patent. Limited intracranial: No abnormal enhancement. Visualized orbits: Unremarkable. Mastoids and visualized paranasal sinuses: Minor mucosal thickening. Mastoid air cells are clear. Skeleton: Minimal cervical spine degenerative changes. Upper chest: Included lungs are clear. Other: Soft tissue edema. IMPRESSION: Oropharyngeal wall thickening/edema  and mild thickening of the epiglottis. No evidence of abscess. Bilateral adenopathy. Airway is patent. Electronically Signed   By: Guadlupe Spanish M.D.   On: 04/01/2021 12:26    Pending Labs Unresulted Labs (From admission, onward)    Start     Ordered   04/08/21 0500  Creatinine, serum  (enoxaparin (LOVENOX)    CrCl >/= 30 ml/min)  Weekly,   R     Comments: while on enoxaparin therapy    04/01/21 1847   04/02/21 0500  Basic metabolic panel  Tomorrow morning,   R        04/01/21 1847   04/02/21 0500  CBC  Tomorrow morning,   R        04/01/21 1847   04/01/21 1425  Culture, blood (routine x 2)  BLOOD CULTURE X 2,   R (with STAT occurrences)      04/01/21 1425   04/01/21 1424  RPR  Once,   STAT        04/01/21 1425   04/01/21 1424  HIV Antibody (routine testing w rflx)  (HIV Antibody (Routine testing w reflex) panel)  Once,   STAT        04/01/21 1425   04/01/21 0924  Culture, group A strep  Once,   STAT        04/01/21 0923          Vitals/Pain Today's Vitals   04/01/21 1317 04/01/21 1720 04/01/21 1800 04/01/21 1830  BP: (!) 154/96 (!) 181/118 (!) 178/110 (!) 178/109  Pulse: 96 92 91 94  Resp: 17 18 18 18   Temp:      TempSrc:      SpO2: 99% 99% 100% 100%    Isolation Precautions No active isolations  Medications Medications  cefTRIAXone (ROCEPHIN) 2 g in sodium chloride 0.9 % 100 mL IVPB (0 g Intravenous Stopped 04/01/21 1730)  dexamethasone (DECADRON) injection 4 mg (has no administration in time range)  enoxaparin (LOVENOX) injection 40 mg (has no administration in time range)  sodium chloride flush (NS) 0.9 % injection 3 mL (has no administration in time range)  0.9 %  sodium chloride infusion (has no administration in time range)  ibuprofen (ADVIL) tablet 400 mg (has no administration in time range)  oxyCODONE (Oxy IR/ROXICODONE) immediate release tablet 5 mg (has no administration in time range)  ketorolac (TORADOL) 15 MG/ML injection 15  mg (has no administration  in time range)  albuterol (PROVENTIL) (2.5 MG/3ML) 0.083% nebulizer solution 2.5 mg (has no administration in time range)  nicotine (NICODERM CQ - dosed in mg/24 hr) patch 7 mg (has no administration in time range)  phenol (CHLORASEPTIC) mouth spray 1 spray (has no administration in time range)  dexamethasone (DECADRON) injection 16 mg (16 mg Intravenous Given 04/01/21 0947)  lidocaine (XYLOCAINE) 2 % viscous mouth solution 15 mL (15 mLs Mouth/Throat Given 04/01/21 0953)  clindamycin (CLEOCIN) IVPB 600 mg (0 mg Intravenous Stopped 04/01/21 1020)  ketorolac (TORADOL) 30 MG/ML injection 30 mg (30 mg Intravenous Given 04/01/21 0947)  iohexol (OMNIPAQUE) 350 MG/ML injection 100 mL (75 mLs Intravenous Contrast Given 04/01/21 1156)    Mobility walks

## 2021-04-01 NOTE — ED Notes (Signed)
Carollee Herter RN informed of elevated BP.

## 2021-04-01 NOTE — ED Notes (Signed)
Updated family with regards to care plan

## 2021-04-02 DIAGNOSIS — F17200 Nicotine dependence, unspecified, uncomplicated: Secondary | ICD-10-CM

## 2021-04-02 DIAGNOSIS — I160 Hypertensive urgency: Secondary | ICD-10-CM

## 2021-04-02 LAB — BASIC METABOLIC PANEL
Anion gap: 13 (ref 5–15)
BUN: 15 mg/dL (ref 6–20)
CO2: 22 mmol/L (ref 22–32)
Calcium: 9.4 mg/dL (ref 8.9–10.3)
Chloride: 102 mmol/L (ref 98–111)
Creatinine, Ser: 1.15 mg/dL (ref 0.61–1.24)
GFR, Estimated: >60 mL/min (ref >60)
Glucose, Bld: 214 mg/dL — ABNORMAL HIGH (ref 70–99)
Potassium: 4.3 mmol/L (ref 3.5–5.1)
Sodium: 137 mmol/L (ref 135–145)

## 2021-04-02 LAB — CBC
HCT: 40.3 % (ref 39.0–52.0)
Hemoglobin: 12.9 g/dL — ABNORMAL LOW (ref 13.0–17.0)
MCH: 27.9 pg (ref 26.0–34.0)
MCHC: 32 g/dL (ref 30.0–36.0)
MCV: 87.2 fL (ref 80.0–100.0)
Platelets: 328 10*3/uL (ref 150–400)
RBC: 4.62 MIL/uL (ref 4.22–5.81)
RDW: 13.9 % (ref 11.5–15.5)
WBC: 12.6 10*3/uL — ABNORMAL HIGH (ref 4.0–10.5)
nRBC: 0 % (ref 0.0–0.2)

## 2021-04-02 LAB — RPR
RPR Ser Ql: REACTIVE — AB
RPR Titer: 1:8 {titer}

## 2021-04-02 LAB — HIV ANTIBODY (ROUTINE TESTING W REFLEX): HIV Screen 4th Generation wRfx: NONREACTIVE

## 2021-04-02 MED ORDER — MELATONIN 3 MG PO TABS
3.0000 mg | ORAL_TABLET | Freq: Once | ORAL | Status: AC
  Administered 2021-04-02: 3 mg via ORAL
  Filled 2021-04-02: qty 1

## 2021-04-02 MED ORDER — AMLODIPINE BESYLATE 5 MG PO TABS
5.0000 mg | ORAL_TABLET | Freq: Every day | ORAL | Status: DC
  Administered 2021-04-02: 5 mg via ORAL
  Filled 2021-04-02: qty 1

## 2021-04-02 MED ORDER — PENICILLIN G BENZATHINE 1200000 UNIT/2ML IM SUSY
2.4000 10*6.[IU] | PREFILLED_SYRINGE | Freq: Once | INTRAMUSCULAR | Status: AC
  Administered 2021-04-02: 2.4 10*6.[IU] via INTRAMUSCULAR
  Filled 2021-04-02: qty 4

## 2021-04-02 MED ORDER — HYDRALAZINE HCL 25 MG PO TABS
25.0000 mg | ORAL_TABLET | Freq: Four times a day (QID) | ORAL | Status: DC | PRN
  Administered 2021-04-02 – 2021-04-03 (×2): 25 mg via ORAL
  Filled 2021-04-02 (×2): qty 1

## 2021-04-02 MED ORDER — DOXYCYCLINE HYCLATE 100 MG PO TABS
100.0000 mg | ORAL_TABLET | Freq: Two times a day (BID) | ORAL | Status: DC
  Administered 2021-04-02 – 2021-04-03 (×3): 100 mg via ORAL
  Filled 2021-04-02 (×3): qty 1

## 2021-04-02 MED ORDER — CEFDINIR 300 MG PO CAPS
300.0000 mg | ORAL_CAPSULE | Freq: Two times a day (BID) | ORAL | Status: DC
  Administered 2021-04-02 – 2021-04-03 (×3): 300 mg via ORAL
  Filled 2021-04-02 (×3): qty 1

## 2021-04-02 NOTE — Consult Note (Signed)
[facility]    Date of Admission:  04/01/2021     Reason for Consult: epiglotitis    Referring Provider: [place]     Abx: 8/17-c ceftriaxone        Assessment: Epiglotitis High risk sexual behavior Rash -- suspect monkey pox   40 yo male MSM here for 1-2 days progressive swellling of throat/dyspnea, dx'ed with epiglotitis, also with new papular/pustular rash   Causes for infectious epiglotitis usually viral. But bacterial species similar to those causing sinusitis/pna, including staph aureus  Pending std screen Hiv serology negative  He has prior syphilis. Rpr 05/2020 is 1:64. Repeat rpr this admission 1:8. Given high risk, will treat with 1 dose of 2.4 million units pcn benzathine  Rash -- rpr testing in process, but suspect monkey pox   High risk sexual behavior -- discussed with him PrEP which he is interested in. Can further the subject in ID clinic   Plan:  Finish 7 days cefdinir/doxycycline for the epiglottitis 1 dose 2.4 million units benzathin pcn F/u rcid clinic for testing results Will send the rash to test for monkey pox -- no indication to treat empirically with tpoxx at this time Once all testing done (discussed with nursing about sending oral/rectal sample for gc/chlamydia) can discharge from id standpoint Follow up [facility] clinic 8/26 @ 1015 with dr Renold Don Discussed with primary team  I spent 60 minute reviewing data/chart, and coordinating care and >50% direct face to face time providing counseling/discussing diagnostics/treatment plan with patient      ------------------------------------------------ Principal Problem:   Pharyngitis Active Problems:   Epiglottitis    HPI: Roger Wright is a 40 y.o. male msm, admitted for 1-2 days progressive swelling in throat, also found to have new onset of pustular rash here this admission   1 day prior to admission had throat fullness/swellling sensation that was  interfering with his speech/eating. He had some sneezing prior to this. No f/c. No cough, dental issue, chest pain.  Patient reports unprotected MSM sex recently  On presentation afebrile, and no leukocytosis Ct neck however suggest epiglottitis Ent saw and recommend steroid for antiinflammatory and empiric abx  Hiv screen so far nonrective  Patient unawared when I noticed pustular rash on his upper ext/trunk but he said he didn't notice till today. Rash not itchy/painful   He denies penile discharge, rectal pain, joint pain otherwise  Since admission his dyspnea/fullness in throat is better  He never uses PrEP before, but is interested in it  History reviewed. No pertinent family history.  Social History   Tobacco Use   Smoking status: Every Day   Smokeless tobacco: Never  Substance Use Topics   Alcohol use: Not Currently    Comment: my drinking is inconsistant, It used to be very heavy  "   Drug use: Yes    Types: Marijuana    No Known Allergies  Review of Systems: ROS All Other ROS was negative, except mentioned above   Past Medical History:  Diagnosis Date   Alcohol intoxication delirium (HCC) 12/30/2014   Kidney stone        Scheduled Meds:  dexamethasone (DECADRON) injection  4 mg Intravenous Q8H   enoxaparin (LOVENOX) injection  40 mg Subcutaneous Q24H   nicotine  7 mg Transdermal Daily   sodium chloride flush  3 mL Intravenous Q12H   Continuous Infusions:  sodium chloride 75 mL/hr at 04/02/21 0904   cefTRIAXone (ROCEPHIN)  IV Stopped (04/01/21 1730)   PRN Meds:.albuterol, hydrALAZINE, ibuprofen, ketorolac, oxyCODONE, phenol   OBJECTIVE: Blood pressure (!) 181/120, pulse (!) 109, temperature (!) 97.5 F (36.4 C), resp. rate 14, SpO2 100 %.  Physical Exam  General/constitutional: no distress, pleasant HEENT: Normocephalic, PER, Conj Clear, EOMI, Oropharynx clear Neck supple CV: rrr no mrg Lungs: clear to auscultation, normal respiratory  effort Abd: Soft, Nontender Ext: no edema Skin: multiple nontender papular/pustular rash on upper ext and trunk; no penile lesion or perianal lesion Neuro: nonfocal MSK: no peripheral joint swelling/tenderness/warmth; back spines nontender Psych: alert/oriented  Lab Results Lab Results  Component Value Date   WBC 12.6 (H) 04/02/2021   HGB 12.9 (L) 04/02/2021   HCT 40.3 04/02/2021   MCV 87.2 04/02/2021   PLT 328 04/02/2021    Lab Results  Component Value Date   CREATININE 1.15 04/02/2021   BUN 15 04/02/2021   NA 137 04/02/2021   K 4.3 04/02/2021   CL 102 04/02/2021   CO2 22 04/02/2021    Lab Results  Component Value Date   ALT 19 04/01/2021   AST 25 04/01/2021   ALKPHOS 78 04/01/2021   BILITOT 0.6 04/01/2021      Microbiology: Recent Results (from the past 240 hour(s))  Group A Strep by PCR     Status: None   Collection Time: 04/01/21  9:55 AM   Specimen: Sterile Swab  Result Value Ref Range Status   Group A Strep by PCR NOT DETECTED NOT DETECTED Final    Comment: Performed at [facility], [address] [place], [place] [address]  Resp Panel by RT-PCR (Flu A&B, Covid)     Status: None   Collection Time: 04/01/21  9:55 AM   Specimen: Nasopharyngeal(NP) swabs in vial transport medium  Result Value Ref Range Status   SARS Coronavirus 2 by RT PCR NEGATIVE NEGATIVE Final    Comment: (NOTE) SARS-CoV-2 target nucleic acids are NOT DETECTED.  The SARS-CoV-2 RNA is generally detectable in upper respiratory specimens during the acute phase of infection. The lowest concentration of SARS-CoV-2 viral copies this assay can detect is 138 copies/mL. A negative result does not preclude SARS-Cov-2 infection and should not be used as the sole basis for treatment or other patient management decisions. A negative result may occur with  improper specimen collection/handling, submission of specimen other than nasopharyngeal swab, presence of viral mutation(s)  within the areas targeted by this assay, and inadequate number of viral copies(<138 copies/mL). A negative result must be combined with clinical observations, patient history, and epidemiological information. The expected result is Negative.  Fact Sheet for Patients:  BloggerCourse.com  Fact Sheet for Healthcare Providers:  SeriousBroker.it  This test is no t yet approved or cleared by the [place] FDA and  has been authorized for detection and/or diagnosis of SARS-CoV-2 by FDA under an Emergency Use Authorization (EUA). This EUA will remain  in effect (meaning this test can be used) for the duration of the COVID-19 declaration under Section 564(b)(1) of the Act, 21 U.S.C.section 360bbb-3(b)(1), unless the authorization is terminated  or revoked sooner.       Influenza A by PCR NEGATIVE NEGATIVE Final   Influenza B by PCR NEGATIVE NEGATIVE Final    Comment: (NOTE) The Xpert Xpress SARS-CoV-2/FLU/RSV plus assay is intended as an aid in the diagnosis of influenza from Nasopharyngeal swab specimens and should not be used as a sole basis for treatment. Nasal washings and aspirates are unacceptable for Xpert Xpress  SARS-CoV-2/FLU/RSV testing.  Fact Sheet for Patients: BloggerCourse.com  Fact Sheet for Healthcare Providers: SeriousBroker.it  This test is not yet approved or cleared by the [place] FDA and has been authorized for detection and/or diagnosis of SARS-CoV-2 by FDA under an Emergency Use Authorization (EUA). This EUA will remain in effect (meaning this test can be used) for the duration of the COVID-19 declaration under Section 564(b)(1) of the Act, 21 U.S.C. section 360bbb-3(b)(1), unless the authorization is terminated or revoked.  Performed at [facility], [address], [place], [place] [address]      Serology:    Imaging: If  present, new imagings (plain films, ct scans, and mri) have been personally visualized and interpreted; radiology reports have been reviewed. Decision making incorporated into the Impression / Recommendations.  8/17 ct neck Oropharyngeal wall thickening/edema and mild thickening of the epiglottis. No evidence of abscess. Bilateral adenopathy. Airway is patent.  Raymondo Band, MD [facility] [phone] pager    04/02/2021, 10:14 AM

## 2021-04-02 NOTE — Progress Notes (Signed)
With PROGRESS NOTE    Roger Wright  VVO:[id number] DOB: July 31, 1981 DOA: 04/01/2021 PCP: Patient, No Pcp Per (Inactive)    Brief Narrative:  Roger Wright is a 40 year old male past medical history significant for tobacco use disorder, EtOH use, THC use who presents to [facility] ED on 8/17 with 2-day history of upper respiratory infection symptoms including dry cough and nasal congestion.  1 day ago, symptoms progressively worsened with throat pain, difficulty swallowing, change in voice with associated dyspnea.  Denies drooling of saliva.  He reported to ED physician that felt like his tongue was swollen.  Attempted to use Tylenol and Advil without much improvement.  No previous episodes.  Denies sick contacts.  Patient reports he is homosexual and practices oral sex.  In the ED, temperature 98.6 F, HR 82, RR 16, BP 175/119, SPO2 100% on room air.  Sodium 138, potassium 4.3, chloride 105, CO2 26, glucose 101, BUN 13, creatinine 1.05.  AST 25, ALT 19, total bilirubin 0.6.  WBC count 7.3, hemoglobin 12.2, platelets 257.  RPR reactive.  Influenza A/B PCR negative.  Cova-19 PCR negative.  Group A strep PCR not detected.  HIV nonreactive.  CT soft tissue neck with contrast with oropharyngeal wall thickening/edema and mild thickening of the epiglottis, no evidence of abscess, bilateral adenopathy with airway patent.  EDP discussed case with ENT on-call Dr. Dillard Cannon who recommended oral clindamycin, steroid Dosepak and follow-up outpatient.  However due to epiglottic swelling, ED P was concerned about acute airway decompensation and recommended hospital admission for close observation.  Patient received a dose of IV clindamycin and Decadron.  Hospital service consulted for further evaluation and management of acute pharyngitis/tonsillitis/epiglottitis.   Assessment & Plan:   Principal Problem:   Pharyngitis Active Problems:   Epiglottitis   Acute pharyngitis, tonsillitis, epiglottitis Cervical  lymphadenopathy Patient presenting to ED with a 2-day history of URI symptoms followed by progressive throat pain, difficulty swallowing with associated change in voice and dyspnea.  Patient is afebrile without leukocytosis.  CT soft tissue neck with contrast  with oropharyngeal wall thickening/edema and mild thickening of the epiglottis, no evidence of abscess, bilateral adenopathy with airway patent.  Case was discussed with the ENT on-call, Dr. Ezzard Standing who recommended oral antibiotics, steroids and outpatient follow-up; but EDP was concerned about airway compromise and requested admission and further evaluation. --Infectious disease following, appreciate assistance --Blood cultures x2: Pending --GC/chlamydia: Pending --Group A strep culture: Pending --Monkey poxvirus DNA PCR: Pending --Ceftriaxone 2 g IV every 24 hours --Doxycycline 100 mg p.o. every 12 hours --Decadron 4 mg IV q8h --Oxycodone 5 mg every 4 hours as needed moderate pain --Toradol 15 mg IV every 6 hours as needed severe pain --Continue close monitoring of respiratory status  Positive RPR --ID following as above --RPR titer: Pending  Hypertensive urgency BP elevated 188/111 this morning, reports not on antihypertensive regimen at home. BP range since admission 143-188/85-120. --Start amlodipine 5 mg p.o. daily --Hydralazine 25 mg PO q6h PRN SBP >170 or DBP >110  Tobacco use disorder Counseled on need for cessation. --Nicotine patch  THC use disorder: Counseled on need for cessation  Alcohol use: Reports doing occasionally every couple weeks.  History of acute hepatitis B: Unclear if he was treated.  Outpatient follow-up.  DVT prophylaxis: enoxaparin (LOVENOX) injection 40 mg Start: 04/01/21 2200   Code Status: Full Code Family Communication: Family updated who is present at bedside this morning  Disposition Plan:  Level of care: Med-Surg Status is: Inpatient  Remains inpatient appropriate because:Unsafe d/c  plan, IV treatments appropriate due to intensity of illness or inability to take PO, and Inpatient level of care appropriate due to severity of illness  Dispo: The patient is from: Home              Anticipated d/c is to: Home              Patient currently is not medically stable to d/c.   Difficult to place patient No   Consultants:  ENT, Dr. Dillard Cannon Infectious disease, Dr. Renold Don  Procedures:  None  Antimicrobials:  Clindamycin 8/17 - 8/17 Ceftriaxone 8/18>> Doxycycline 8/18>>   Subjective: Patient seen examined at bedside, resting comfortably.  Mother present.  Continues with throat pain, but able to tolerate oral intake without much issue.  Concerned about when his next IV antibiotics are scheduled for.  Awaiting for further recommendations per ID.  No other specific complaints or concerns at this time.  Denies headache, no visual changes, no chest pain, palpitations, no shortness of breath, no nausea/vomiting/diarrhea, no abdominal pain, no weakness, no fatigue, no paresthesias.  No acute events overnight per nursing staff.  Objective: Vitals:   04/02/21 0055 04/02/21 0518 04/02/21 0858 04/02/21 1025  BP: (!) 143/88 (!) 154/96 (!) 181/120 (!) 188/111  Pulse: 95 82 (!) 109 (!) 109  Resp: 15 18 14 17   Temp: 98.2 F (36.8 C) 98.6 F (37 C) (!) 97.5 F (36.4 C) 97.6 F (36.4 C)  TempSrc: Oral Oral    SpO2: 100% 98% 100% 100%    Intake/Output Summary (Last 24 hours) at 04/02/2021 1131 Last data filed at 04/02/2021 04/04/2021 Gross per 24 hour  Intake 1242.43 ml  Output --  Net 1242.43 ml   There were no vitals filed for this visit.  Examination:  General exam: Appears calm and comfortable  HEENT: Notable posterior pharyngeal erythema/edema, white tonsillar exudate bilaterally, uvula within normal limits midline without significant edema; bilateral cervical lymphadenopathy appreciated, greatest on left Respiratory system: Clear to auscultation. Respiratory effort  normal.  On room air Cardiovascular system: S1 & S2 heard, RRR. No JVD, murmurs, rubs, gallops or clicks. No pedal edema. Gastrointestinal system: Abdomen is nondistended, soft and nontender. No organomegaly or masses felt. Normal bowel sounds heard. Central nervous system: Alert and oriented. No focal neurological deficits. Extremities: Symmetric 5 x 5 power. Skin: No rashes, lesions or ulcers Psychiatry: Judgement and insight appear normal. Mood & affect appropriate.     Data Reviewed: I have personally reviewed following labs and imaging studies  CBC: Recent Labs  Lab 04/01/21 0955 04/02/21 0846  WBC 7.3 12.6*  NEUTROABS 4.1  --   HGB 12.2* 12.9*  HCT 38.1* 40.3  MCV 88.6 87.2  PLT 257 328   Basic Metabolic Panel: Recent Labs  Lab 04/01/21 0955 04/02/21 0846  NA 138 137  K 4.3 4.3  CL 105 102  CO2 26 22  GLUCOSE 101* 214*  BUN 13 15  CREATININE 1.05 1.15  CALCIUM 8.9 9.4   GFR: CrCl cannot be calculated (Unknown ideal weight.). Liver Function Tests: Recent Labs  Lab 04/01/21 0955  AST 25  ALT 19  ALKPHOS 78  BILITOT 0.6  PROT 7.4  ALBUMIN 3.6   No results for input(s): LIPASE, AMYLASE in the last 168 hours. No results for input(s): AMMONIA in the last 168 hours. Coagulation Profile: No results for input(s): INR, PROTIME in the last 168 hours. Cardiac Enzymes: No results for input(s): CKTOTAL, CKMB, CKMBINDEX,  TROPONINI in the last 168 hours. BNP (last 3 results) No results for input(s): PROBNP in the last 8760 hours. HbA1C: No results for input(s): HGBA1C in the last 72 hours. CBG: No results for input(s): GLUCAP in the last 168 hours. Lipid Profile: No results for input(s): CHOL, HDL, LDLCALC, TRIG, CHOLHDL, LDLDIRECT in the last 72 hours. Thyroid Function Tests: No results for input(s): TSH, T4TOTAL, FREET4, T3FREE, THYROIDAB in the last 72 hours. Anemia Panel: No results for input(s): VITAMINB12, FOLATE, FERRITIN, TIBC, IRON, RETICCTPCT in the  last 72 hours. Sepsis Labs: No results for input(s): PROCALCITON, LATICACIDVEN in the last 168 hours.  Recent Results (from the past 240 hour(s))  Group A Strep by PCR     Status: None   Collection Time: 04/01/21  9:55 AM   Specimen: Sterile Swab  Result Value Ref Range Status   Group A Strep by PCR NOT DETECTED NOT DETECTED Final    Comment: Performed at [facility], [address] [place], [place] [address]  Resp Panel by RT-PCR (Flu A&B, Covid)     Status: None   Collection Time: 04/01/21  9:55 AM   Specimen: Nasopharyngeal(NP) swabs in vial transport medium  Result Value Ref Range Status   SARS Coronavirus 2 by RT PCR NEGATIVE NEGATIVE Final    Comment: (NOTE) SARS-CoV-2 target nucleic acids are NOT DETECTED.  The SARS-CoV-2 RNA is generally detectable in upper respiratory specimens during the acute phase of infection. The lowest concentration of SARS-CoV-2 viral copies this assay can detect is 138 copies/mL. A negative result does not preclude SARS-Cov-2 infection and should not be used as the sole basis for treatment or other patient management decisions. A negative result may occur with  improper specimen collection/handling, submission of specimen other than nasopharyngeal swab, presence of viral mutation(s) within the areas targeted by this assay, and inadequate number of viral copies(<138 copies/mL). A negative result must be combined with clinical observations, patient history, and epidemiological information. The expected result is Negative.  Fact Sheet for Patients:  BloggerCourse.com  Fact Sheet for Healthcare Providers:  SeriousBroker.it  This test is no t yet approved or cleared by the [place] FDA and  has been authorized for detection and/or diagnosis of SARS-CoV-2 by FDA under an Emergency Use Authorization (EUA). This EUA will remain  in effect (meaning this test can be used) for  the duration of the COVID-19 declaration under Section 564(b)(1) of the Act, 21 U.S.C.section 360bbb-3(b)(1), unless the authorization is terminated  or revoked sooner.       Influenza A by PCR NEGATIVE NEGATIVE Final   Influenza B by PCR NEGATIVE NEGATIVE Final    Comment: (NOTE) The Xpert Xpress SARS-CoV-2/FLU/RSV plus assay is intended as an aid in the diagnosis of influenza from Nasopharyngeal swab specimens and should not be used as a sole basis for treatment. Nasal washings and aspirates are unacceptable for Xpert Xpress SARS-CoV-2/FLU/RSV testing.  Fact Sheet for Patients: BloggerCourse.com  Fact Sheet for Healthcare Providers: SeriousBroker.it  This test is not yet approved or cleared by the [place] FDA and has been authorized for detection and/or diagnosis of SARS-CoV-2 by FDA under an Emergency Use Authorization (EUA). This EUA will remain in effect (meaning this test can be used) for the duration of the COVID-19 declaration under Section 564(b)(1) of the Act, 21 U.S.C. section 360bbb-3(b)(1), unless the authorization is terminated or revoked.  Performed at [facility], [address], [place], [place] [address]  Radiology Studies: CT Soft Tissue Neck W Contrast  Result Date: 04/01/2021 CLINICAL DATA:  Throat swelling/tonsillitis EXAM: CT NECK WITH CONTRAST TECHNIQUE: Multidetector CT imaging of the neck was performed using the standard protocol following the bolus administration of intravenous contrast. CONTRAST:  75mL OMNIPAQUE IOHEXOL 350 MG/ML SOLN COMPARISON:  None. FINDINGS: Pharynx and larynx: Prominence of the palatine tonsils and uvula. Thickening of the epiglottis. Submucosal edema in the oropharyngeal wall. No evidence of abscess. Airway is patent. Salivary glands: Parotid and submandibular glands are unremarkable. Thyroid: Normal. Lymph nodes: Increased number of  bilateral nonenlarged and enlarged lymph nodes at all stations. For example, enlarged right level 2 node measuring 2.1 cm and enlarged left level 2 node measuring 2.4 cm both on series 2, image 51. Vascular: Major neck vessels are patent. Limited intracranial: No abnormal enhancement. Visualized orbits: Unremarkable. Mastoids and visualized paranasal sinuses: Minor mucosal thickening. Mastoid air cells are clear. Skeleton: Minimal cervical spine degenerative changes. Upper chest: Included lungs are clear. Other: Soft tissue edema. IMPRESSION: Oropharyngeal wall thickening/edema and mild thickening of the epiglottis. No evidence of abscess. Bilateral adenopathy. Airway is patent. Electronically Signed   By: Guadlupe SpanishPraneil  Patel M.D.   On: 04/01/2021 12:26        Scheduled Meds:  amLODipine  5 mg Oral Daily   dexamethasone (DECADRON) injection  4 mg Intravenous Q8H   doxycycline  100 mg Oral Q12H   enoxaparin (LOVENOX) injection  40 mg Subcutaneous Q24H   nicotine  7 mg Transdermal Daily   sodium chloride flush  3 mL Intravenous Q12H   Continuous Infusions:  sodium chloride 75 mL/hr at 04/02/21 0904   cefTRIAXone (ROCEPHIN)  IV Stopped (04/01/21 1730)     LOS: 0 days    Time spent: 39 minutes spent on chart review, discussion with nursing staff, consultants, updating family and interview/physical exam; more than 50% of that time was spent in counseling and/or coordination of care.    Alvira PhilipsEric J [place], DO [facility] Available via Epic secure chat 7am-7pm After these hours, please refer to coverage provider listed on [email or web address] 04/02/2021, 11:31 AM

## 2021-04-03 ENCOUNTER — Inpatient Hospital Stay (HOSPITAL_COMMUNITY): Payer: Self-pay

## 2021-04-03 DIAGNOSIS — Z7252 High risk homosexual behavior: Secondary | ICD-10-CM

## 2021-04-03 DIAGNOSIS — I10 Essential (primary) hypertension: Secondary | ICD-10-CM

## 2021-04-03 LAB — CULTURE, GROUP A STREP (THRC)

## 2021-04-03 LAB — CYTOLOGY, (ORAL, ANAL, URETHRAL) ANCILLARY ONLY
Chlamydia: NEGATIVE
Comment: NEGATIVE
Comment: NORMAL
Comment: NEGATIVE
Neisseria Gonorrhea: NEGATIVE
Trichomonas: NEGATIVE

## 2021-04-03 MED ORDER — AMLODIPINE BESYLATE 10 MG PO TABS: 10.0000 mg | ORAL_TABLET | Freq: Every day | ORAL | 2 refills | Status: DC

## 2021-04-03 MED ORDER — KETOROLAC TROMETHAMINE 60 MG/2ML IM SOLN
15.0000 mg | Freq: Once | INTRAMUSCULAR | Status: DC
  Filled 2021-04-03: qty 2

## 2021-04-03 MED ORDER — HYDROCHLOROTHIAZIDE 25 MG PO TABS
25.0000 mg | ORAL_TABLET | Freq: Every day | ORAL | 2 refills | Status: DC
Start: 2021-04-04 — End: 2022-05-22

## 2021-04-03 MED ORDER — CEFDINIR 300 MG PO CAPS: 300.0000 mg | ORAL_CAPSULE | Freq: Two times a day (BID) | ORAL | 0 refills | Status: AC

## 2021-04-03 MED ORDER — AMLODIPINE BESYLATE 10 MG PO TABS
10.0000 mg | ORAL_TABLET | Freq: Every day | ORAL | Status: DC
  Administered 2021-04-03: 10 mg via ORAL
  Filled 2021-04-03: qty 1

## 2021-04-03 MED ORDER — HYDROCHLOROTHIAZIDE 25 MG PO TABS
25.0000 mg | ORAL_TABLET | Freq: Every day | ORAL | Status: DC
  Administered 2021-04-03: 25 mg via ORAL
  Filled 2021-04-03: qty 1

## 2021-04-03 MED ORDER — DOXYCYCLINE HYCLATE 100 MG PO TABS: 100.0000 mg | ORAL_TABLET | Freq: Two times a day (BID) | ORAL | 0 refills | Status: AC

## 2021-04-03 NOTE — Progress Notes (Signed)
Patient removed periph iv.  Stated he didn't want a iv line, refused new line.  Patient also refusing tele.

## 2021-04-03 NOTE — Discharge Summary (Addendum)
Physician Discharge Summary  Roger Wright ZOX:[id number] DOB: 1980/12/06 DOA: 04/01/2021  PCP: Patient, No Pcp Per (Inactive)  Admit date: 04/01/2021 Discharge date: 04/03/2021  Admitted From: Home Disposition: Home  Recommendations for Outpatient Follow-up:  Follow up with PCP in 1-2 weeks Follow-up with ENT, Dr. Ezzard Standing in 1-2 weeks, call for appointment Follow-up with infectious disease, Dr. Renold Don as scheduled on 04/10/2021 at 10:15 AM Continue antibiotics with cefdinir and doxycycline to complete 7-day course Started on amlodipine and HCTZ for poorly controlled hypertension Follow-up monkey pox screen, RPR titer, STD screening which was pending at time of discharge  Home Health: No Equipment/Devices: None  Discharge Condition: Stable CODE STATUS: Full code Diet recommendation: Heart healthy diet  History of present illness:  Roger Wright is a 40 year old male past medical history significant for tobacco use disorder, EtOH use, THC use who presents to [facility] ED on 8/17 with 2-day history of upper respiratory infection symptoms including dry cough and nasal congestion.  1 day ago, symptoms progressively worsened with throat pain, difficulty swallowing, change in voice with associated dyspnea.  Denies drooling of saliva.  He reported to ED physician that felt like his tongue was swollen.  Attempted to use Tylenol and Advil without much improvement.  No previous episodes.  Denies sick contacts.  Patient reports he is homosexual and practices oral sex.   In the ED, temperature 98.6 F, HR 82, RR 16, BP 175/119, SPO2 100% on room air.  Sodium 138, potassium 4.3, chloride 105, CO2 26, glucose 101, BUN 13, creatinine 1.05.  AST 25, ALT 19, total bilirubin 0.6.  WBC count 7.3, hemoglobin 12.2, platelets 257.  RPR reactive.  Influenza A/B PCR negative.  Cova-19 PCR negative.  Group A strep PCR not detected.  HIV nonreactive.  CT soft tissue neck with contrast with oropharyngeal wall thickening/edema and  mild thickening of the epiglottis, no evidence of abscess, bilateral adenopathy with airway patent.  EDP discussed case with ENT on-call Dr. Dillard Cannon who recommended oral clindamycin, steroid Dosepak and follow-up outpatient.  However due to epiglottic swelling, ED P was concerned about acute airway decompensation and recommended hospital admission for close observation.  Patient received a dose of IV clindamycin and Decadron.  Hospital service consulted for further evaluation and management of acute pharyngitis/tonsillitis/epiglottitis.  Hospital course:  Acute pharyngitis, tonsillitis, epiglottitis Cervical lymphadenopathy Patient presenting to ED with a 2-day history of URI symptoms followed by progressive throat pain, difficulty swallowing with associated change in voice and dyspnea.  Patient is afebrile without leukocytosis.  CT soft tissue neck with contrast  with oropharyngeal wall thickening/edema and mild thickening of the epiglottis, no evidence of abscess, bilateral adenopathy with airway patent.  Case was discussed with the ENT on-call, Dr. Ezzard Standing who recommended oral antibiotics, steroids and outpatient follow-up; but EDP was concerned about airway compromise and requested admission and further evaluation.  Infectious disease was consulted and followed during hospitalization.  Patient was initially started on ceftriaxone and doxycycline and de-escalated to cefdinir and doxycycline with plan 7-day course.  Patient completed course of IV steroids with Decadron while inpatient.  Will discharge home to complete antibiotics with outpatient follow-up with ENT, Dr. Ezzard Standing in 1-2 weeks and Dr. Renold Don with infectious disease on 04/10/2021.   Positive RPR Received penicillin while inpatient.  Outpatient follow-up with ID as above.   Hypertensive urgency BP elevated 188/111 this morning, reports not on antihypertensive regimen at home.  Patient started on antihypertensives with amlodipine 10 mg  p.o. daily and hydrochlorothiazide 25  mg p.o. daily.  Will need follow-up outpatient.   Tobacco use disorder Counseled on need for cessation.   THC use disorder: Counseled on need for cessation   Alcohol use: Reports doing occasionally every couple weeks.   History of acute hepatitis B: Unclear if he was treated.  Outpatient follow-up.  Left middle finger pain Patient reports unknown etiology to finger pain.  Notable skin defect lateral aspect of left middle finger.  Ordered x-ray with no foreign body present.  On antibiotics as above.  Outpatient follow-up.  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  Outpatient follow-up with ID.  Discharge Diagnoses:  Principal Problem:   Pharyngitis Active Problems:   Epiglottitis   Essential hypertension    Discharge Instructions  Discharge Instructions     Call MD for:  difficulty breathing, headache or visual disturbances   Complete by: As directed    Call MD for:  extreme fatigue   Complete by: As directed    Call MD for:  persistant dizziness or light-headedness   Complete by: As directed    Call MD for:  persistant nausea and vomiting   Complete by: As directed    Call MD for:  severe uncontrolled pain   Complete by: As directed    Call MD for:  temperature >100.4   Complete by: As directed    Diet - low sodium heart healthy   Complete by: As directed    Increase activity slowly   Complete by: As directed       Allergies as of 04/03/2021   No Known Allergies      Medication List     TAKE these medications    amLODipine 10 MG tablet Commonly known as: NORVASC Take 1 tablet (10 mg total) by mouth daily. Start taking on: April 04, 2021   cefdinir 300 MG capsule Commonly known as: OMNICEF Take 1 capsule (300 mg total) by mouth every 12 (twelve) hours for 6 days.   doxycycline 100 MG tablet Commonly known as: VIBRA-TABS Take 1 tablet (100 mg total) by mouth every 12 (twelve)  hours for 6 days.   hydrochlorothiazide 25 MG tablet Commonly known as: HYDRODIURIL Take 1 tablet (25 mg total) by mouth daily. Start taking on: April 04, 2021        Follow-up Information     Vu, Tonita Phoenix, MD. Go on 04/10/2021.   Specialty: Infectious Diseases Why: 1015am Contact information: [address] [place] [address] [phone]         Drema Halon, MD. Schedule an appointment as soon as possible for a visit in 1 week(s).   Specialty: Otolaryngology Contact information: [address] [place] [address] [phone]                No Known Allergies  Consultations: Infectious disease, Dr. Renold Don ENT, Dr. Dillard Cannon   Procedures/Studies: CT Soft Tissue Neck W Contrast  Result Date: 04/01/2021 CLINICAL DATA:  Throat swelling/tonsillitis EXAM: CT NECK WITH CONTRAST TECHNIQUE: Multidetector CT imaging of the neck was performed using the standard protocol following the bolus administration of intravenous contrast. CONTRAST:  73mL OMNIPAQUE IOHEXOL 350 MG/ML SOLN COMPARISON:  None. FINDINGS: Pharynx and larynx: Prominence of the palatine tonsils and uvula. Thickening of the epiglottis. Submucosal edema in the oropharyngeal wall. No evidence of abscess. Airway is patent. Salivary glands: Parotid and submandibular glands are unremarkable. Thyroid: Normal. Lymph nodes: Increased number of bilateral nonenlarged and enlarged  lymph nodes at all stations. For example, enlarged right level 2 node measuring 2.1 cm and enlarged left level 2 node measuring 2.4 cm both on series 2, image 51. Vascular: Major neck vessels are patent. Limited intracranial: No abnormal enhancement. Visualized orbits: Unremarkable. Mastoids and visualized paranasal sinuses: Minor mucosal thickening. Mastoid air cells are clear. Skeleton: Minimal cervical spine degenerative changes. Upper chest: Included lungs are clear. Other: Soft tissue edema. IMPRESSION:  Oropharyngeal wall thickening/edema and mild thickening of the epiglottis. No evidence of abscess. Bilateral adenopathy. Airway is patent. Electronically Signed   By: Guadlupe Spanish M.D.   On: 04/01/2021 12:26     Subjective: Patient seen examined at bedside, resting comfortably.  Irritable overnight, not removed his IV and telemetry.  Patient is aggressive to staff this morning.  Mother was present at bedside.  Discharging home with outpatient follow-up as scheduled.  Discharge Exam: Vitals:   04/03/21 0647 04/03/21 1005  BP: (!) 177/121 (!) 203/131  Pulse:  99  Resp:  18  Temp:  97.8 F (36.6 C)  SpO2:     Vitals:   04/02/21 2046 04/03/21 0532 04/03/21 0647 04/03/21 1005  BP: (!) 171/117 (!) 194/134 (!) 177/121 (!) 203/131  Pulse: 92 87  99  Resp: Temp: 98.5 F (36.9 C) 97.6 F (36.4 C)  97.8 F (36.6 C)  TempSrc: Oral Oral  Oral  SpO2: 99% 99%    Weight:      Height:        General: Pt is alert, awake, not in acute distress irritable HEENT: Mild edema bilateral tonsils, uvula midline, white patches noted bilateral tonsils, no significant erythema Cardiovascular: RRR, S1/S2 +, no rubs, no gallops Respiratory: CTA bilaterally, no wheezing, no rhonchi, on room air Abdominal: Soft, NT, ND, bowel sounds + Extremities: no edema, no cyanosis    The results of significant diagnostics from this hospitalization (including imaging, microbiology, ancillary and laboratory) are listed below for reference.     Microbiology: Recent Results (from the past 240 hour(s))  Group A Strep by PCR     Status: None   Collection Time: 04/01/21  9:55 AM   Specimen: Sterile Swab  Result Value Ref Range Status   Group A Strep by PCR NOT DETECTED NOT DETECTED Final    Comment: Performed at [facility], [address] [place], [place] [address]  Culture, group A strep     Status: None   Collection Time: 04/01/21  9:55 AM   Specimen: Throat  Result Value Ref  Range Status   Specimen Description   Final    THROAT Performed at [facility], [address], [place], [place] [address]    Special Requests   Final    NONE Performed at [facility], [address] [place], [place] [address]    Culture RARE STREPTOCOCCUS,BETA HEMOLYTIC NOT GROUP A  Final   Report Status 04/03/2021 FINAL  Final  Resp Panel by RT-PCR (Flu A&B, Covid)     Status: None   Collection Time: 04/01/21  9:55 AM   Specimen: Nasopharyngeal(NP) swabs in vial transport medium  Result Value Ref Range Status   SARS Coronavirus 2 by RT PCR NEGATIVE NEGATIVE Final    Comment: (NOTE) SARS-CoV-2 target nucleic acids are NOT DETECTED.  The SARS-CoV-2 RNA is generally detectable in upper respiratory specimens during the acute phase of infection. The lowest concentration of SARS-CoV-2 viral copies this assay can detect is 138  copies/mL. A negative result does not preclude SARS-Cov-2 infection and should not be used as the sole basis for treatment or other patient management decisions. A negative result may occur with  improper specimen collection/handling, submission of specimen other than nasopharyngeal swab, presence of viral mutation(s) within the areas targeted by this assay, and inadequate number of viral copies(<138 copies/mL). A negative result must be combined with clinical observations, patient history, and epidemiological information. The expected result is Negative.  Fact Sheet for Patients:  BloggerCourse.comhttps://www.fda.gov/media/152166/download  Fact Sheet for Healthcare Providers:  SeriousBroker.ithttps://www.fda.gov/media/152162/download  This test is no t yet approved or cleared by the [place] FDA and  has been authorized for detection and/or diagnosis of SARS-CoV-2 by FDA under an Emergency Use Authorization (EUA). This EUA will remain  in effect (meaning this test can be used) for the duration of the COVID-19 declaration under Section  564(b)(1) of the Act, 21 U.S.C.section 360bbb-3(b)(1), unless the authorization is terminated  or revoked sooner.       Influenza A by PCR NEGATIVE NEGATIVE Final   Influenza B by PCR NEGATIVE NEGATIVE Final    Comment: (NOTE) The Xpert Xpress SARS-CoV-2/FLU/RSV plus assay is intended as an aid in the diagnosis of influenza from Nasopharyngeal swab specimens and should not be used as a sole basis for treatment. Nasal washings and aspirates are unacceptable for Xpert Xpress SARS-CoV-2/FLU/RSV testing.  Fact Sheet for Patients: BloggerCourse.comhttps://www.fda.gov/media/152166/download  Fact Sheet for Healthcare Providers: SeriousBroker.ithttps://www.fda.gov/media/152162/download  This test is not yet approved or cleared by the [place] FDA and has been authorized for detection and/or diagnosis of SARS-CoV-2 by FDA under an Emergency Use Authorization (EUA). This EUA will remain in effect (meaning this test can be used) for the duration of the COVID-19 declaration under Section 564(b)(1) of the Act, 21 U.S.C. section 360bbb-3(b)(1), unless the authorization is terminated or revoked.  Performed at [facility], [address] [place], [place] [address]   Culture, blood (routine x 2)     Status: None (Preliminary result)   Collection Time: 04/01/21  5:16 PM   Specimen: BLOOD  Result Value Ref Range Status   Specimen Description   Final    BLOOD RIGHT ANTECUBITAL Performed at [facility], [address] [place], [place] [address]    Special Requests   Final    BOTTLES DRAWN AEROBIC AND ANAEROBIC Blood Culture results may not be optimal due to an excessive volume of blood received in culture bottles Performed at [facility], [address] [place], [place] [address]    Culture   Final    NO GROWTH 2 DAYS Performed at [facility], [address] [place], [place] [address]    Report Status PENDING  Incomplete  Culture, blood (routine  x 2)     Status: None (Preliminary result)   Collection Time: 04/01/21  9:15 PM   Specimen: BLOOD  Result Value Ref Range Status   Specimen Description   Final    BLOOD RIGHT ANTECUBITAL Performed at [facility], [address] [place], [place] [address]    Special Requests   Final    BOTTLES DRAWN AEROBIC ONLY Blood Culture adequate volume Performed at [facility], [address] [place], [place] [address]    Culture   Final    NO GROWTH 2 DAYS Performed at [facility], [address] [place], [place] [address]    Report Status PENDING  Incomplete  Labs: BNP (last 3 results) No results for input(s): BNP in the last 8760 hours. Basic Metabolic Panel: Recent Labs  Lab 04/01/21 0955 04/02/21 0846  NA 138 137  K 4.3 4.3  CL 105 102  CO2 26 22  GLUCOSE 101* 214*  BUN 13 15  CREATININE 1.05 1.15  CALCIUM 8.9 9.4   Liver Function Tests: Recent Labs  Lab 04/01/21 0955  AST 25  ALT 19  ALKPHOS 78  BILITOT 0.6  PROT 7.4  ALBUMIN 3.6   No results for input(s): LIPASE, AMYLASE in the last 168 hours. No results for input(s): AMMONIA in the last 168 hours. CBC: Recent Labs  Lab 04/01/21 0955 04/02/21 0846  WBC 7.3 12.6*  NEUTROABS 4.1  --   HGB 12.2* 12.9*  HCT 38.1* 40.3  MCV 88.6 87.2  PLT 257 328   Cardiac Enzymes: No results for input(s): CKTOTAL, CKMB, CKMBINDEX, TROPONINI in the last 168 hours. BNP: Invalid input(s): POCBNP CBG: No results for input(s): GLUCAP in the last 168 hours. D-Dimer No results for input(s): DDIMER in the last 72 hours. Hgb A1c No results for input(s): HGBA1C in the last 72 hours. Lipid Profile No results for input(s): CHOL, HDL, LDLCALC, TRIG, CHOLHDL, LDLDIRECT in the last 72 hours. Thyroid function studies No results for input(s): TSH, T4TOTAL, T3FREE, THYROIDAB in the last 72 hours.  Invalid input(s): FREET3 Anemia work up No results for input(s): VITAMINB12, FOLATE,  FERRITIN, TIBC, IRON, RETICCTPCT in the last 72 hours. Urinalysis    Component Value Date/Time   COLORURINE YELLOW 06/13/2020 1855   APPEARANCEUR CLOUDY (A) 06/13/2020 1855   LABSPEC 1.012 06/13/2020 1855   PHURINE 6.0 06/13/2020 1855   GLUCOSEU NEGATIVE 06/13/2020 1855   HGBUR LARGE (A) 06/13/2020 1855   BILIRUBINUR NEGATIVE 06/13/2020 1855   KETONESUR NEGATIVE 06/13/2020 1855   PROTEINUR 100 (A) 06/13/2020 1855   UROBILINOGEN 1.0 12/30/2014 0720   NITRITE POSITIVE (A) 06/13/2020 1855   LEUKOCYTESUR LARGE (A) 06/13/2020 1855   Sepsis Labs Invalid input(s): PROCALCITONIN,  WBC,  LACTICIDVEN Microbiology Recent Results (from the past 240 hour(s))  Group A Strep by PCR     Status: None   Collection Time: 04/01/21  9:55 AM   Specimen: Sterile Swab  Result Value Ref Range Status   Group A Strep by PCR NOT DETECTED NOT DETECTED Final    Comment: Performed at [facility], [address], [place], [place] [address]  Culture, group A strep     Status: None   Collection Time: 04/01/21  9:55 AM   Specimen: Throat  Result Value Ref Range Status   Specimen Description   Final    THROAT Performed at [facility], [address], [place], [place] [address]    Special Requests   Final    NONE Performed at [facility], [address], [place], [place] [address]    Culture RARE STREPTOCOCCUS,BETA HEMOLYTIC NOT GROUP A  Final   Report Status 04/03/2021 FINAL  Final  Resp Panel by RT-PCR (Flu A&B, Covid)     Status: None   Collection Time: 04/01/21  9:55 AM   Specimen: Nasopharyngeal(NP) swabs in vial transport medium  Result Value Ref Range Status   SARS Coronavirus 2 by RT PCR NEGATIVE NEGATIVE Final    Comment: (NOTE) SARS-CoV-2 target nucleic acids are NOT DETECTED.  The SARS-CoV-2 RNA is generally detectable in upper respiratory specimens during the acute phase of infection. The lowest concentration of SARS-CoV-2 viral  copies this assay can detect is 138 copies/mL. A negative result does not preclude SARS-Cov-2 infection and should not be used as the sole basis for treatment or other patient management decisions. A negative result may occur with  improper specimen collection/handling, submission of specimen other than nasopharyngeal swab, presence of viral mutation(s) within the areas targeted by this assay, and inadequate number of viral copies(<138 copies/mL). A negative result must be combined with clinical observations, patient history, and epidemiological information. The expected result is Negative.  Fact Sheet for Patients:  BloggerCourse.com  Fact Sheet for Healthcare Providers:  SeriousBroker.it  This test is no t yet approved or cleared by the [place] FDA and  has been authorized for detection and/or diagnosis of SARS-CoV-2 by FDA under an Emergency Use Authorization (EUA). This EUA will remain  in effect (meaning this test can be used) for the duration of the COVID-19 declaration under Section 564(b)(1) of the Act, 21 U.S.C.section 360bbb-3(b)(1), unless the authorization is terminated  or revoked sooner.       Influenza A by PCR NEGATIVE NEGATIVE Final   Influenza B by PCR NEGATIVE NEGATIVE Final    Comment: (NOTE) The Xpert Xpress SARS-CoV-2/FLU/RSV plus assay is intended as an aid in the diagnosis of influenza from Nasopharyngeal swab specimens and should not be used as a sole basis for treatment. Nasal washings and aspirates are unacceptable for Xpert Xpress SARS-CoV-2/FLU/RSV testing.  Fact Sheet for Patients: BloggerCourse.com  Fact Sheet for Healthcare Providers: SeriousBroker.it  This test is not yet approved or cleared by the [place] FDA and has been authorized for detection and/or diagnosis of SARS-CoV-2 by FDA under an Emergency Use Authorization (EUA). This  EUA will remain in effect (meaning this test can be used) for the duration of the COVID-19 declaration under Section 564(b)(1) of the Act, 21 U.S.C. section 360bbb-3(b)(1), unless the authorization is terminated or revoked.  Performed at [facility], [address] [place], [place] [address]   Culture, blood (routine x 2)     Status: None (Preliminary result)   Collection Time: 04/01/21  5:16 PM   Specimen: BLOOD  Result Value Ref Range Status   Specimen Description   Final    BLOOD RIGHT ANTECUBITAL Performed at [facility], [address], [place], [place] [address]    Special Requests   Final    BOTTLES DRAWN AEROBIC AND ANAEROBIC Blood Culture results may not be optimal due to an excessive volume of blood received in culture bottles Performed at [facility], [address], [place], [place] [address]    Culture   Final    NO GROWTH 2 DAYS Performed at [facility], [address], [place], [place] [address]    Report Status PENDING  Incomplete  Culture, blood (routine x 2)     Status: None (Preliminary result)   Collection Time: 04/01/21  9:15 PM   Specimen: BLOOD  Result Value Ref Range Status   Specimen Description   Final    BLOOD RIGHT ANTECUBITAL Performed at [facility], [address] [place], [place] [address]    Special Requests   Final    BOTTLES DRAWN AEROBIC ONLY Blood Culture adequate volume Performed at [facility], [address], [place], [place] [address]    Culture   Final    NO GROWTH 2 DAYS Performed at [facility], [address] [place], [place] [address]    Report  Status PENDING  Incomplete     Time coordinating discharge: Over 30 minutes  SIGNED:   Alvira Philips [place], DO  [facility] 04/03/2021, 11:31 AM

## 2021-04-03 NOTE — Progress Notes (Signed)
RN in room trying to give pt his prn pain medication and scheduled BP meds. Pt became verbally aggressive and started yelling and started to throw and hit things in his room. Pt was agitated that he did not know what was going on with him. RN left room and called security to standby while RN administered medications. MD made aware. Discharge order placed.

## 2021-04-04 LAB — MONKEYPOX VIRUS DNA, QUALITATIVE REAL-TIME PCR: Orthopoxvirus DNA, QL PCR: DETECTED — AB

## 2021-04-06 LAB — CULTURE, BLOOD (ROUTINE X 2)
Culture: NO GROWTH
Culture: NO GROWTH
Special Requests: ADEQUATE

## 2021-04-10 ENCOUNTER — Other Ambulatory Visit: Payer: Self-pay

## 2021-04-10 ENCOUNTER — Ambulatory Visit (INDEPENDENT_AMBULATORY_CARE_PROVIDER_SITE_OTHER): Payer: Self-pay | Admitting: Internal Medicine

## 2021-04-10 ENCOUNTER — Encounter: Payer: Self-pay | Admitting: Internal Medicine

## 2021-04-10 VITALS — BP 181/96 | HR 102 | Resp 16 | Ht 69.0 in | Wt 176.4 lb

## 2021-04-10 DIAGNOSIS — A539 Syphilis, unspecified: Secondary | ICD-10-CM

## 2021-04-10 DIAGNOSIS — R21 Rash and other nonspecific skin eruption: Secondary | ICD-10-CM

## 2021-04-10 DIAGNOSIS — Z7252 High risk homosexual behavior: Secondary | ICD-10-CM

## 2021-04-10 LAB — T.PALLIDUM AB, TOTAL: T Pallidum Abs: REACTIVE — AB

## 2021-04-10 MED ORDER — EMTRICITABINE-TENOFOVIR AF 200-25 MG PO TABS: 1.0000 | ORAL_TABLET | Freq: Every day | ORAL | 2 refills | Status: AC

## 2021-04-10 NOTE — Progress Notes (Signed)
[facility]  Patient Active Problem List   Diagnosis Date Noted   Essential hypertension 04/03/2021   Pharyngitis 04/01/2021   Epiglottitis 04/01/2021   Acute hepatitis B    Transaminitis 12/30/2014   Acute hepatitis 12/30/2014   Alcohol intoxication (HCC)       Subjective:    Patient ID: Roger Wright, male    DOB: 11-Nov-1980, 40 y.o.   MRN: [id number]  Cc: hospital f/u   HPI:  Roger Wright is a 40 y.o. male here for f/u hospital admission for epiglottitis, during which monkey pox also dx'ed  Not all lesions have scabbed over. Rash started around 8/17-18/2022 No sorethroat/dyspnea Patient feels well otherwise  No f/c    No Known Allergies    Outpatient Medications Prior to Visit  Medication Sig Dispense Refill   amLODipine (NORVASC) 10 MG tablet Take 1 tablet (10 mg total) by mouth daily. 30 tablet 2   hydrochlorothiazide (HYDRODIURIL) 25 MG tablet Take 1 tablet (25 mg total) by mouth daily. 30 tablet 2   No facility-administered medications prior to visit.     Social History   Socioeconomic History   Marital status: Single    Spouse name: Not on file   Number of children: Not on file   Years of education: Not on file   Highest education level: Not on file  Occupational History   Not on file  Tobacco Use   Smoking status: Every Day   Smokeless tobacco: Never  Substance and Sexual Activity   Alcohol use: Not Currently    Comment: my drinking is inconsistant, It used to be very heavy  "   Drug use: Yes    Types: Marijuana   Sexual activity: Yes    Birth control/protection: Condom  Other Topics Concern   Not on file  Social History Narrative   Not on file   Social Determinants of Health   Financial Resource Strain: Not on file  Food Insecurity: Not on file  Transportation Needs: Not on file  Physical Activity: Not on file  Stress: Not on file  Social Connections: Not on file  Intimate Partner Violence:  Not on file      Review of Systems    Other ros negative  Objective:    BP (!) 181/96   Pulse (!) 102   Resp 16   Ht 5\' 9"  (1.753 m)   Wt 176 lb 6.4 oz (80 kg)   SpO2 100%   BMI 26.05 kg/m  Nursing note and vital signs reviewed.  Physical Exam      General/constitutional: no distress, pleasant HEENT: Normocephalic, PER, Conj Clear, EOMI, Oropharynx clear Neck supple CV: rrr no mrg Lungs: clear to auscultation, normal respiratory effort Abd: Soft, Nontender Ext: no edema Skin: most lesions had scabbed over except the left hand with just ruptured vessicles Neuro: nonfocal MSK: no peripheral joint swelling/tenderness/warmth; back spines nontender         Labs: Lab Results  Component Value Date   WBC 12.6 (H) 04/02/2021   HGB 12.9 (L) 04/02/2021   HCT 40.3 04/02/2021   MCV 87.2 04/02/2021   PLT 328 04/02/2021   Last metabolic panel Lab Results  Component Value Date   GLUCOSE 214 (H) 04/02/2021   NA 137 04/02/2021   K 4.3 04/02/2021   CL 102 04/02/2021   CO2 22 04/02/2021   BUN 15 04/02/2021   CREATININE 1.15 04/02/2021   GFRNONAA >60 04/02/2021  GFRAA >60 09/20/2018   CALCIUM 9.4 04/02/2021   PROT 7.4 04/01/2021   ALBUMIN 3.6 04/01/2021   BILITOT 0.6 04/01/2021   ALKPHOS 78 04/01/2021   AST 25 04/01/2021   ALT 19 04/01/2021   ANIONGAP 13 04/02/2021    Micro:  Serology: Gc/chlam rectal/oral swab negative  Rpr 1:8  Imaging:  Assessment & Plan:   Problem List Items Addressed This Visit   None Visit Diagnoses     High risk homosexual behavior    -  Primary   Rash       Syphilis           -start descovy for prep. Discussed how to -had discussed with our pharmacy team to contact patient and let him know if he is a candidate for injectable prep -f/u 3 months with labs and hepatitis screen -continue monkey pox isolation until all lesions scabbed over   -patient's updated phone is [phone]   No orders of the defined types  were placed in this encounter.     Follow-up: Return in about 3 months (around 07/11/2021).   I have spent a total of 20 minutes of face-to-face and non-face-to-face time, excluding clinical staff time, preparing to see patient, ordering tests and/or medications, and provide counseling the patient    Raymondo Band, MD [facility] [phone]  pager   [phone] cell 04/10/2021, 10:32 AM

## 2021-04-10 NOTE — Patient Instructions (Signed)
Continue monkey pox isolation until all lesions scabbed over, which should be around another 2 weeks   I have prescribed descovy for prophylaxis. Take this once a day, every day even without sexual encounter. You can start 1-2 weeks from now  We are discussing with our pharmacy team to see if you are eligible for the injectable PreP medication. They'll call and let you know   Let us know if the co-pay for the descovy is unreasonable and our pharnacy team will figure out a way around it

## 2021-04-13 ENCOUNTER — Telehealth: Payer: Self-pay

## 2021-04-13 ENCOUNTER — Other Ambulatory Visit (HOSPITAL_COMMUNITY): Payer: Self-pay

## 2021-04-13 NOTE — Progress Notes (Signed)
Thanks Donna!

## 2021-04-13 NOTE — Telephone Encounter (Signed)
[facility] Patient Advocate Encounter  Completed and sent [employer] Advancing Access application for Descovy  for this patient who is uninsured.    Patient is approved 04/13/21 through 08/15/21.     Clearance Coots, CPhT Specialty Pharmacy Patient [facility] for Infectious Disease Phone: [phone] Fax:  [phone]

## 2021-04-14 ENCOUNTER — Telehealth: Payer: Self-pay

## 2021-04-14 NOTE — Telephone Encounter (Signed)
[facility] Patient Advocate Encounter  Completed and sent [employer] application for Apretude for this patient who is uninsured.    Patient assistance phone number for follow up is 04/14/21.   This encounter will be updated until final determination.   Clearance Coots, CPhT Specialty Pharmacy Patient [facility] for Infectious Disease Phone: [phone] Fax:  [phone]

## 2021-04-15 ENCOUNTER — Telehealth: Payer: Self-pay

## 2021-04-15 ENCOUNTER — Telehealth: Payer: Self-pay | Admitting: Pharmacist

## 2021-04-15 NOTE — Telephone Encounter (Signed)
Patient is approved to receive Apretude through patient assistance. Called to schedule an appointment for end of next week. No answer, left HIPAA compliant VM.

## 2021-04-15 NOTE — Telephone Encounter (Signed)
[facility] Patient Advocate Encounter  Completed and sent [employer] application for Apretude for this patient who is uninsured.    Patient is approved 04/15/21 through 04/15/22.  Medication will be supplied from [facility]    Clearance Coots, CPhT Specialty Pharmacy Patient [facility] for Infectious Disease Phone: [phone] Fax:  [phone]

## 2021-04-16 ENCOUNTER — Encounter: Payer: Self-pay | Admitting: Pharmacist

## 2021-04-17 ENCOUNTER — Other Ambulatory Visit: Payer: Self-pay | Admitting: Pharmacist

## 2021-04-17 ENCOUNTER — Other Ambulatory Visit (HOSPITAL_COMMUNITY): Payer: Self-pay

## 2021-04-17 DIAGNOSIS — Z79899 Other long term (current) drug therapy: Secondary | ICD-10-CM

## 2021-04-17 MED ORDER — APRETUDE 600 MG/3ML IM SUER
600.0000 mg | INTRAMUSCULAR | 5 refills | Status: DC
  Filled 2021-04-17: qty 3, 60d supply, fill #0

## 2021-04-17 NOTE — Telephone Encounter (Signed)
Spoke with patient on the phone and reviewed our Apretude process here. He is scheduled for his first injection and lab appointment on 9/21.

## 2021-04-17 NOTE — Telephone Encounter (Signed)
I will put him on my schedule to order his medication

## 2021-04-22 ENCOUNTER — Telehealth: Payer: Self-pay

## 2021-04-22 NOTE — Telephone Encounter (Signed)
[facility] Patient Advocate Encounter  Patient's medication (Apretude)have been couriered to [facility] from [employer] and will be administered on patient next office visit on 05/06/21.  [employer] Patient Assistance Program  Clearance Coots , CPhT Specialty Pharmacy Patient [facility] for Infectious Disease Phone: [phone] Fax:  [phone]

## 2021-05-06 ENCOUNTER — Other Ambulatory Visit: Payer: Self-pay

## 2021-05-06 ENCOUNTER — Ambulatory Visit: Payer: Self-pay | Admitting: Pharmacist

## 2021-07-13 ENCOUNTER — Telehealth: Payer: Self-pay

## 2021-07-13 ENCOUNTER — Ambulatory Visit: Payer: Self-pay | Admitting: Internal Medicine

## 2021-07-13 NOTE — Telephone Encounter (Signed)
[facility] Patient Advocate Encounter  Patient's medication (APRETUDE) have been couriered to [facility] from [facility] and will be administered on patient next office visit on 07/13/21.  [employer] Patient Assistance  Clearance Coots , CPhT Specialty Pharmacy Patient [facility] for Infectious Disease Phone: [phone] Fax:  [phone]

## 2022-03-18 ENCOUNTER — Other Ambulatory Visit (HOSPITAL_COMMUNITY): Payer: Self-pay

## 2022-04-21 ENCOUNTER — Emergency Department (HOSPITAL_COMMUNITY)
Admission: EM | Admit: 2022-04-21 | Discharge: 2022-04-22 | Disposition: A | Discharge status: Home / Self Care | Payer: Self-pay | Attending: Emergency Medicine | Admitting: Emergency Medicine

## 2022-04-21 ENCOUNTER — Other Ambulatory Visit: Payer: Self-pay

## 2022-04-21 ENCOUNTER — Encounter (HOSPITAL_COMMUNITY): Payer: Self-pay

## 2022-04-21 DIAGNOSIS — K602 Anal fissure, unspecified: Secondary | ICD-10-CM | POA: Insufficient documentation

## 2022-04-21 DIAGNOSIS — A630 Anogenital (venereal) warts: Secondary | ICD-10-CM | POA: Insufficient documentation

## 2022-04-21 DIAGNOSIS — A5131 Condyloma latum: Secondary | ICD-10-CM | POA: Insufficient documentation

## 2022-04-21 MED ORDER — LIDOCAINE HCL URETHRAL/MUCOSAL 2 % EX GEL
1.0000 | Freq: Once | CUTANEOUS | Status: AC
  Administered 2022-04-21: 1 via TOPICAL
  Filled 2022-04-21: qty 11

## 2022-04-21 MED ORDER — PENICILLIN G BENZATHINE 1200000 UNIT/2ML IM SUSY
1.2000 10*6.[IU] | PREFILLED_SYRINGE | Freq: Once | INTRAMUSCULAR | Status: AC
  Administered 2022-04-22: 1.2 10*6.[IU] via INTRAMUSCULAR
  Filled 2022-04-21: qty 2

## 2022-04-21 NOTE — ED Triage Notes (Signed)
Pt states that he went to the bathroom and was pushing hard. Pt noticed bright red bleeding and has soreness and swelling to his anus.

## 2022-04-21 NOTE — ED Provider Notes (Signed)
[facility]EMERGENCY DEPT Provider Note   CSN: [id number] Arrival date & time: 04/21/22  2115     History  Chief Complaint  Patient presents with   Hemorrhoids    Roger Wright is a 41 y.o. male.  The history is provided by the patient.  Illness Location:  Perirectal area Quality:  Pain and bleeding and felt a bulge Severity:  Moderate Onset quality:  Gradual Duration:  5 days Progression:  Waxing and waning Chronicity:  New Context:  Straining to have a Bowel movement Relieved by:  Nothing Worsened by:  Nothing Ineffective treatments:  None Associated symptoms: no diarrhea, no fever, no headaches, no loss of consciousness and no wheezing        Home Medications Prior to Admission medications   Medication Sig Start Date End Date Taking? Authorizing Provider  amLODipine (NORVASC) 10 MG tablet Take 1 tablet (10 mg total) by mouth daily. 04/04/21 07/03/21  [place], Alvira Philips, DO  hydrochlorothiazide (HYDRODIURIL) 25 MG tablet Take 1 tablet (25 mg total) by mouth daily. 04/04/21 07/03/21  [place], Eric J, DO      Allergies    Patient has no known allergies.    Review of Systems   Review of Systems  Constitutional:  Negative for fever.  HENT:  Negative for ear discharge.   Eyes:  Negative for redness.  Respiratory:  Negative for wheezing and stridor.   Gastrointestinal:  Positive for anal bleeding. Negative for diarrhea.  Neurological:  Negative for loss of consciousness and headaches.  All other systems reviewed and are negative.   Physical Exam Updated Vital Signs BP (!) 164/118   Pulse 87   Temp 99.2 F (37.3 C) (Oral)   Resp 17   Ht 5\' 9"  (1.753 m)   Wt 77.1 kg   SpO2 99%   BMI 25.10 kg/m  Physical Exam Vitals and nursing note reviewed.  Constitutional:      General: He is not in acute distress.    Appearance: He is well-developed. He is not diaphoretic.  HENT:     Head: Normocephalic and atraumatic.     Nose: Nose normal.   Eyes:     Conjunctiva/sclera: Conjunctivae normal.     Pupils: Pupils are equal, round, and reactive to light.  Cardiovascular:     Rate and Rhythm: Normal rate and regular rhythm.     Pulses: Normal pulses.     Heart sounds: Normal heart sounds.  Pulmonary:     Effort: Pulmonary effort is normal.     Breath sounds: Normal breath sounds. No wheezing or rales.  Abdominal:     General: Bowel sounds are normal.     Palpations: Abdomen is soft.     Tenderness: There is no abdominal tenderness. There is no guarding or rebound.  Genitourinary:    Comments: Anal fissure 6 oclock position and perianal condyloma.  Lata and accuminata Musculoskeletal:        General: Normal range of motion.     Cervical back: Normal range of motion and neck supple.  Skin:    General: Skin is warm and dry.     Capillary Refill: Capillary refill takes less than 2 seconds.  Neurological:     General: No focal deficit present.     Mental Status: He is alert and oriented to person, place, and time.     Deep Tendon Reflexes: Reflexes normal.  Psychiatric:        Mood and Affect: Mood normal.  Behavior: Behavior normal.     ED Results / Procedures / Treatments   Labs (all labs ordered are listed, but only abnormal results are displayed) Labs Reviewed - No data to display  EKG None  Radiology No results found.  Procedures Procedures    Medications Ordered in ED Medications  lidocaine (XYLOCAINE) 2 % jelly 1 Application (1 Application Topical Given by Other 04/21/22 2325)    ED Course/ Medical Decision Making/ A&P                           Medical Decision Making Patient with rectal pain and bleeding post straining for BM   Amount and/or Complexity of Data Reviewed External Data Reviewed: notes.    Details: Previous notes reviewed  Labs: ordered.    Details: RPR ordered by me   Risk Prescription drug management. Risk Details: Given presence of condyloma lata have treated for  syphilis in the ED.  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  Will refer to [facility] for management options.  Sitz bath instructions given regarding fissure.  Stable for discharge.  Strict return.      Final Clinical Impression(s) / ED Diagnoses Final diagnoses:  None   Return for intractable cough, coughing up blood, fevers > 100.4 unrelieved by medication, shortness of breath, intractable vomiting, chest pain, shortness of breath, weakness, numbness, changes in speech, facial asymmetry, abdominal pain, passing out, Inability to tolerate liquids or food, cough, altered mental status or any concerns. No signs of systemic illness or infection. The patient is nontoxic-appearing on exam and vital signs are within normal limits.  I have reviewed the triage vital signs and the nursing notes. Pertinent labs & imaging results that were available during my care of the patient were reviewed by me and considered in my medical decision making (see chart for details). After history, exam, and medical workup I feel the patient has been appropriately medically screened and is safe for discharge home. Pertinent diagnoses were discussed with the patient. Patient was given return precautions.  Rx / DC Orders ED Discharge Orders     None         Trelon Plush, MD 04/22/22 4259

## 2022-04-21 NOTE — ED Provider Triage Note (Signed)
Emergency Medicine Provider Triage Evaluation Note  Roger Wright , a 41 y.o. male  was evaluated in triage.  Pt complains of rectal pain.  States he had a bowel movement 5 days ago.  Felt a bulge in his rectum.  Initially had some bright red blood initial onset however blood has resolved.  Had bowel movement 2 days ago without any blood.  States he has some rectal pain.  Does follow with infectious disease for high risk MSM behavior. No hx of hemorrhoids   Review of Systems  Positive: Rectal pain, bleeding Negative:   Physical Exam  BP (!) 166/116 (BP Location: Right Arm)   Pulse (!) 101   Temp 99.2 F (37.3 C) (Oral)   Resp 17   Ht 5\' 9"  (1.753 m)   Wt 77.1 kg   SpO2 99%   BMI 25.10 kg/m  Gen:   Awake, no distress   Resp:  Normal effort  MSK:   Moves extremities without difficulty  Other:    Medical Decision Making  Medically screening exam initiated at 10:10 PM.  Appropriate orders placed.  Roger Wright was informed that the remainder of the evaluation will be completed by another provider, this initial triage assessment does not replace that evaluation, and the importance of remaining in the ED until their evaluation is complete.  Recta pain   Roger Wright A, PA-C 04/21/22 2211

## 2022-04-22 LAB — RPR
RPR Ser Ql: REACTIVE — AB
RPR Titer: 1:64 {titer}

## 2022-04-22 MED ORDER — ACETAMINOPHEN 325 MG PO TABS
650.0000 mg | ORAL_TABLET | Freq: Once | ORAL | Status: AC
  Administered 2022-04-22: 650 mg via ORAL
  Filled 2022-04-22: qty 2

## 2022-04-22 NOTE — ED Notes (Signed)
Computer not working for pt. To sign for discharge paperwork.  Pt. Verbalized understanding of instructions. Pt. Had no further questions for care team.

## 2022-04-23 LAB — T.PALLIDUM AB, TOTAL: T Pallidum Abs: REACTIVE — AB

## 2022-05-22 ENCOUNTER — Encounter (HOSPITAL_BASED_OUTPATIENT_CLINIC_OR_DEPARTMENT_OTHER): Payer: Self-pay | Admitting: Emergency Medicine

## 2022-05-22 ENCOUNTER — Other Ambulatory Visit: Payer: Self-pay

## 2022-05-22 ENCOUNTER — Emergency Department (HOSPITAL_BASED_OUTPATIENT_CLINIC_OR_DEPARTMENT_OTHER)
Admission: EM | Admit: 2022-05-22 | Discharge: 2022-05-22 | Disposition: A | Discharge status: Home / Self Care | Payer: Self-pay | Attending: Emergency Medicine | Admitting: Emergency Medicine

## 2022-05-22 ENCOUNTER — Emergency Department (HOSPITAL_BASED_OUTPATIENT_CLINIC_OR_DEPARTMENT_OTHER): Payer: Self-pay

## 2022-05-22 DIAGNOSIS — I10 Essential (primary) hypertension: Secondary | ICD-10-CM | POA: Insufficient documentation

## 2022-05-22 DIAGNOSIS — Z79899 Other long term (current) drug therapy: Secondary | ICD-10-CM | POA: Insufficient documentation

## 2022-05-22 DIAGNOSIS — R072 Precordial pain: Secondary | ICD-10-CM

## 2022-05-22 LAB — CBC WITH DIFFERENTIAL/PLATELET
Abs Immature Granulocytes: 0.01 10*3/uL (ref 0.00–0.07)
Basophils Absolute: 0 10*3/uL (ref 0.0–0.1)
Basophils Relative: 1 %
Eosinophils Absolute: 0.1 10*3/uL (ref 0.0–0.5)
Eosinophils Relative: 2 %
HCT: 36.7 % — ABNORMAL LOW (ref 39.0–52.0)
Hemoglobin: 12.4 g/dL — ABNORMAL LOW (ref 13.0–17.0)
Immature Granulocytes: 0 %
Lymphocytes Relative: 44 %
Lymphs Abs: 1.8 10*3/uL (ref 0.7–4.0)
MCH: 29.2 pg (ref 26.0–34.0)
MCHC: 33.8 g/dL (ref 30.0–36.0)
MCV: 86.4 fL (ref 80.0–100.0)
Monocytes Absolute: 0.4 10*3/uL (ref 0.1–1.0)
Monocytes Relative: 10 %
Neutro Abs: 1.8 10*3/uL (ref 1.7–7.7)
Neutrophils Relative %: 43 %
Platelets: 225 10*3/uL (ref 150–400)
RBC: 4.25 MIL/uL (ref 4.22–5.81)
RDW: 14.3 % (ref 11.5–15.5)
WBC: 4.1 10*3/uL (ref 4.0–10.5)
nRBC: 0 % (ref 0.0–0.2)

## 2022-05-22 LAB — BASIC METABOLIC PANEL
Anion gap: 6 (ref 5–15)
BUN: 15 mg/dL (ref 6–20)
CO2: 25 mmol/L (ref 22–32)
Calcium: 9 mg/dL (ref 8.9–10.3)
Chloride: 106 mmol/L (ref 98–111)
Creatinine, Ser: 1.13 mg/dL (ref 0.61–1.24)
GFR, Estimated: >60 mL/min (ref >60)
Glucose, Bld: 95 mg/dL (ref 70–99)
Potassium: 4.2 mmol/L (ref 3.5–5.1)
Sodium: 137 mmol/L (ref 135–145)

## 2022-05-22 LAB — TROPONIN I (HIGH SENSITIVITY)
Troponin I (High Sensitivity): 14 ng/L (ref <18)
Troponin I (High Sensitivity): 15 ng/L (ref <18)

## 2022-05-22 MED ORDER — AMLODIPINE BESYLATE 5 MG PO TABS: 5.0000 mg | ORAL_TABLET | Freq: Every day | ORAL | 0 refills | Status: DC

## 2022-05-22 NOTE — ED Notes (Signed)
Pt d/c home with [facility] staff per EDP order. Discharge summary reviewed, verbalize understanding. Ambulatory off unit. No s/s of acute distress noted at discharge.

## 2022-05-22 NOTE — ED Provider Notes (Signed)
[facility] EMERGENCY DEPARTMENT Provider Note   CSN: [id number] Arrival date & time: 05/22/22  1255     History  Chief Complaint  Patient presents with   Chest Pain    Roger Wright is a 41 y.o. male.  Patient with history of hypertension, currently untreated, presents to the emergency department for chest discomfort.  He is currently at [facility] for drug rehabilitation.  Previously used methamphetamine, cocaine and alcohol.  States that he awoke this morning with a headache.  While eating, he developed some chest discomfort described as tightness.  No associated exertional symptoms, diaphoresis or vomiting.  Reports anxiety with the chest pain.  Patient denies risk factors for pulmonary embolism including: unilateral leg swelling, history of DVT/PE/other blood clots, use of exogenous hormones, recent immobilizations, recent surgery, recent travel (>4hr segment), malignancy, hemoptysis.  No history of high cholesterol or diabetes.  No abdominal pain constipation or diarrhea.       Home Medications Prior to Admission medications   Medication Sig Start Date End Date Taking? Authorizing Provider  amLODipine (NORVASC) 10 MG tablet Take 1 tablet (10 mg total) by mouth daily. 04/04/21 07/03/21  [place], Alvira Philips, DO  hydrochlorothiazide (HYDRODIURIL) 25 MG tablet Take 1 tablet (25 mg total) by mouth daily. 04/04/21 07/03/21  [place], Eric J, DO      Allergies    Patient has no known allergies.    Review of Systems   Review of Systems  Physical Exam Updated Vital Signs BP (!) 167/105   Pulse 70   Temp 98.5 F (36.9 C) (Oral)   Resp 15   Ht 5\' 9"  (1.753 m)   Wt 82.1 kg   SpO2 100%   BMI 26.73 kg/m  Physical Exam Vitals and nursing note reviewed.  Constitutional:      Appearance: He is well-developed. He is not diaphoretic.  HENT:     Head: Normocephalic and atraumatic.     Mouth/Throat:     Mouth: Mucous membranes are not dry.  Eyes:     Conjunctiva/sclera:  Conjunctivae normal.  Neck:     Vascular: Normal carotid pulses. No carotid bruit or JVD.     Trachea: Trachea normal. No tracheal deviation.  Cardiovascular:     Rate and Rhythm: Normal rate and regular rhythm.     Pulses: No decreased pulses.          Radial pulses are 2+ on the right side and 2+ on the left side.     Heart sounds: Normal heart sounds, S1 normal and S2 normal. Heart sounds not distant. No murmur heard. Pulmonary:     Effort: Pulmonary effort is normal. No respiratory distress.     Breath sounds: Normal breath sounds. No wheezing.  Chest:     Chest wall: No tenderness.  Abdominal:     General: Bowel sounds are normal.     Palpations: Abdomen is soft.     Tenderness: There is no abdominal tenderness. There is no guarding or rebound.  Musculoskeletal:     Cervical back: Normal range of motion and neck supple. No muscular tenderness.     Right lower leg: No edema.     Left lower leg: No edema.  Skin:    General: Skin is warm and dry.     Coloration: Skin is not pale.  Neurological:     Mental Status: He is alert. Mental status is at baseline.  Psychiatric:        Mood and Affect: Mood normal.  ED Results / Procedures / Treatments   Labs (all labs ordered are listed, but only abnormal results are displayed) Labs Reviewed  CBC WITH DIFFERENTIAL/PLATELET - Abnormal; Notable for the following components:      Result Value   Hemoglobin 12.4 (*)    HCT 36.7 (*)    All other components within normal limits  BASIC METABOLIC PANEL  TROPONIN I (HIGH SENSITIVITY)  TROPONIN I (HIGH SENSITIVITY)    ED ECG REPORT   Date: 05/22/2022  Rate: 68  Rhythm: normal sinus rhythm  QRS Axis: indeterminate  Intervals: normal  ST/T Wave abnormalities: nonspecific ST/T changes  Conduction Disutrbances:nonspecific intraventricular conduction delay  Narrative Interpretation:   Old EKG Reviewed: unchanged from 12/2014  I have personally reviewed the EKG tracing and agree  with the computerized printout as noted.  Radiology DG Chest 2 View  Result Date: 05/22/2022 CLINICAL DATA:  Chest pain EXAM: CHEST - 2 VIEW COMPARISON:  12/26/2013 FINDINGS: Transverse diameter of heart is slightly increased. There are no signs of pulmonary edema or focal pulmonary consolidation. There is no pleural effusion or pneumothorax. IMPRESSION: No active cardiopulmonary disease. Electronically Signed   By: Ernie Avena M.D.   On: 05/22/2022 14:00    Procedures Procedures    Medications Ordered in ED Medications - No data to display  ED Course/ Medical Decision Making/ A&P    Patient seen and examined. History obtained directly from patient.   Labs/EKG: Ordered CBC, BMP, troponin, EKG.  Imaging: Ordered chest x-ray.  Medications/Fluids: None ordered  Most recent vital signs reviewed and are as follows: BP (!) 167/105   Pulse 70   Temp 98.5 F (36.9 C) (Oral)   Resp 15   Ht 5\' 9"  (1.753 m)   Wt 82.1 kg   SpO2 100%   BMI 26.73 kg/m   Initial impression: Nonspecific chest pain, headache  5:10 PM Reassessment performed. Patient appears comfortable, asking for something to eat.   Labs personally reviewed and interpreted including: Troponin 14>15 and stable; CBC mild anemia with hemoglobin 12.4 otherwise unremarkable; BMP unremarkable.  Imaging personally visualized and interpreted including: Chest x-ray, agree no acute infiltrates.  Reviewed pertinent lab work and imaging with patient at bedside. Questions answered.   Most current vital signs reviewed and are as follows: BP 136/88   Pulse 64   Temp 98.5 F (36.9 C) (Oral)   Resp 15   Ht 5\' 9"  (1.753 m)   Wt 82.1 kg   SpO2 100%   BMI 26.73 kg/m   Plan: Discharge to home.   Prescriptions written for: Amlodipine 5 mg p.o. daily  Other home care instructions discussed: Avoidance of triggers  Return and follow-up instructions: I encouraged patient to return to ED with severe chest pain,  especially if the pain is crushing or pressure-like and spreads to the arms, back, neck, or jaw, or if they have associated sweating, vomiting, or shortness of breath with the pain, or significant pain with activity. We discussed that the evaluation here today indicates a low-risk of serious cause of chest pain, including heart trouble or a blood clot, but no evaluation is perfect and chest pain can evolve with time. The patient verbalized understanding and agreed.  I encouraged patient to follow-up with their provider in the next 48 hours for recheck.                             Medical Decision Making Amount and/or Complexity  of Data Reviewed Labs: ordered. Radiology: ordered.  Risk Prescription drug management.   For this patient's complaint of chest pain, the following emergent conditions were considered on the differential diagnosis: acute coronary syndrome, pulmonary embolism, pneumothorax, myocarditis, pericardial tamponade, aortic dissection, thoracic aortic aneurysm complication, esophageal perforation.   Other causes were also considered including: gastroesophageal reflux disease, musculoskeletal pain including costochondritis, pneumonia/pleurisy, herpes zoster, pericarditis.  In regards to possibility of ACS, patient has atypical features of pain, non-ischemic and unchanged EKG and negative troponin(s). Heart score was calculated to be 2.   In regards to possibility of PE, symptoms are atypical for PE and risk profile is low, patient is PERC negative.   The patient's vital signs, pertinent lab work and imaging were reviewed and interpreted as discussed in the ED course. Hospitalization was considered for further testing, treatments, or serial exams/observation. However as patient is well-appearing, has a stable exam, and reassuring studies today, I do not feel that they warrant admission at this time. This plan was discussed with the patient who verbalizes agreement and comfort with  this plan and seems reliable and able to return to the Emergency Department with worsening or changing symptoms.          Final Clinical Impression(s) / ED Diagnoses Final diagnoses:  Precordial pain  Hypertension, unspecified type    Rx / DC Orders ED Discharge Orders          Ordered    amLODipine (NORVASC) 5 MG tablet  Daily        05/22/22 1710              Carlisle Cater, PA-C 05/22/22 [place], [place], DO 05/23/22 [phone]

## 2022-05-22 NOTE — ED Triage Notes (Addendum)
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

## 2022-05-22 NOTE — ED Notes (Addendum)
Headache since this morning ,Chest pain for an hour. Reports aniexty. Chest pain in center of chest

## 2022-05-22 NOTE — Discharge Instructions (Addendum)
Please read and follow all provided instructions.  Your diagnoses today include:  1. Precordial pain     Tests performed today include: An EKG of your heart: stable from 2016 A chest x-ray: no signs of pneumonia  Cardiac enzymes - a blood test for heart muscle damage were both normal and stable Blood counts and electrolytes Vital signs. See below for your results today.   Medications prescribed:  Amlodipine: daily medication for blood pressure  Take any prescribed medications only as directed.  Follow-up instructions: Please follow-up with your primary care provider as soon as you can for further evaluation of your symptoms.   Return instructions:  SEEK IMMEDIATE MEDICAL ATTENTION IF: You have severe chest pain, especially if the pain is crushing or pressure-like and spreads to the arms, back, neck, or jaw, or if you have sweating, nausea or vomiting, or trouble with breathing. THIS IS AN EMERGENCY. Do not wait to see if the pain will go away. Get medical help at once. Call 911. DO NOT drive yourself to the hospital.  Your chest pain gets worse and does not go away after a few minutes of rest.  You have an attack of chest pain lasting longer than what you usually experience.  You have significant dizziness, if you pass out, or have trouble walking.  You have chest pain not typical of your usual pain for which you originally saw your caregiver.  You have any other emergent concerns regarding your health.  Additional Information: Chest pain comes from many different causes. Your caregiver has diagnosed you as having chest pain that is not specific for one problem, but does not require admission.  You are at low risk for an acute heart condition or other serious illness.   Your vital signs today were: BP (!) 147/113   Pulse 65   Temp 98.5 F (36.9 C) (Oral)   Resp 15   Ht 5\' 9"  (1.753 m)   Wt 82.1 kg   SpO2 100%   BMI 26.73 kg/m  If your blood pressure (BP) was elevated above  135/85 this visit, please have this repeated by your doctor within one month. --------------

## 2022-06-28 IMAGING — DX DG FINGER MIDDLE 2+V*L*
3 series · 3 of 3 positions shown · non-contrast
Comparison: None.

CLINICAL DATA: Distal left middle finger pain

EXAM:
LEFT MIDDLE FINGER 2+V

[finger ap]
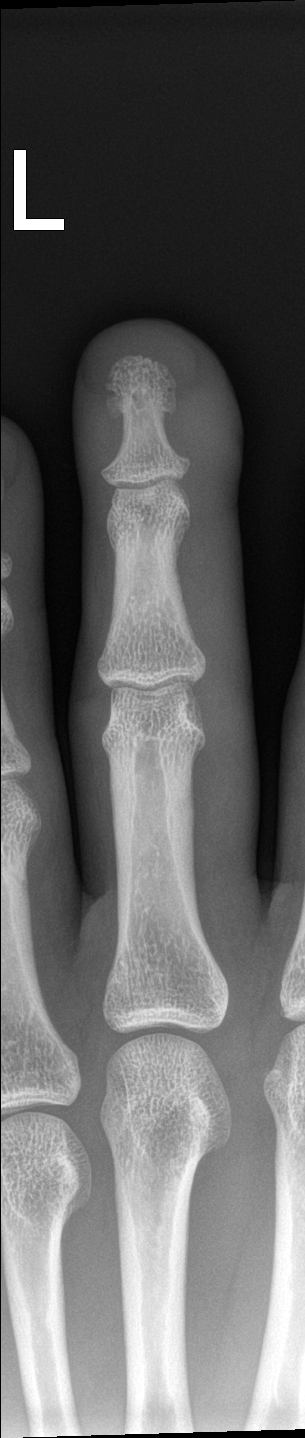

[finger obl]
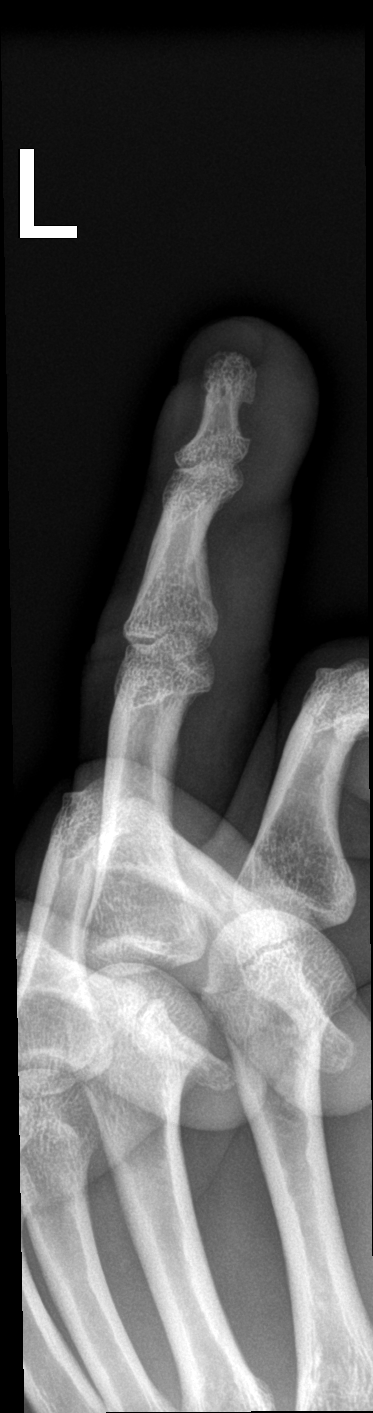

[finger lat]
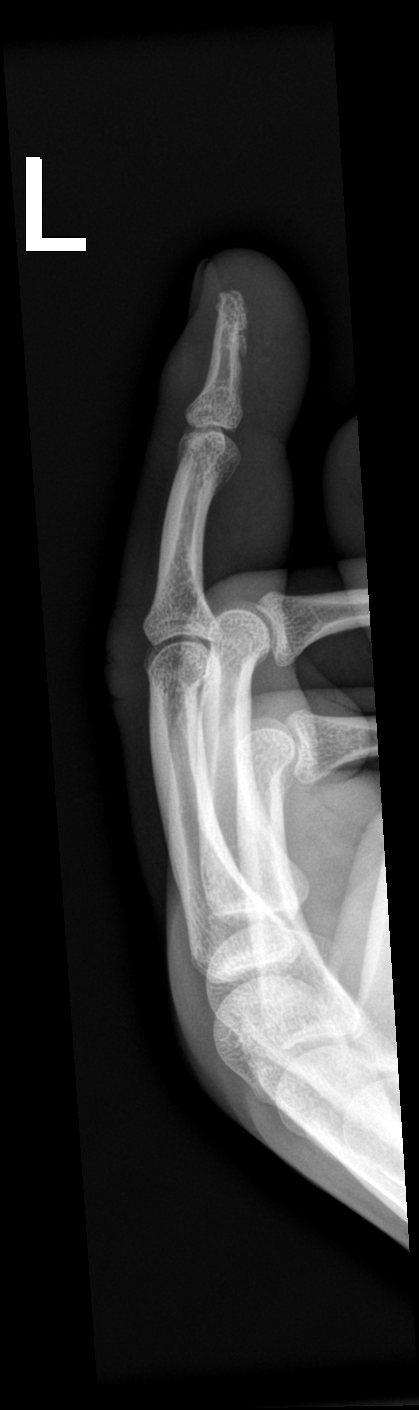

[3 of 3 positions shown; findings below may reference images not displayed]

FINDINGS: There is no acute fracture or dislocation. Alignment is normal. The
joint spaces are preserved. The soft tissues are unremarkable. There
is no soft tissue gas. There is no radiopaque foreign body.
IMPRESSION: Unremarkable middle finger radiographs.

## 2022-09-14 ENCOUNTER — Encounter (HOSPITAL_COMMUNITY): Payer: Self-pay

## 2022-09-14 ENCOUNTER — Emergency Department (HOSPITAL_COMMUNITY): Payer: 59

## 2022-09-14 ENCOUNTER — Other Ambulatory Visit: Payer: Self-pay

## 2022-09-14 ENCOUNTER — Emergency Department (HOSPITAL_COMMUNITY)
Admission: EM | Admit: 2022-09-14 | Discharge: 2022-09-14 | Disposition: A | Discharge status: Home / Self Care | Payer: 59 | Attending: Emergency Medicine | Admitting: Emergency Medicine

## 2022-09-14 DIAGNOSIS — Z1152 Encounter for screening for COVID-19: Secondary | ICD-10-CM | POA: Insufficient documentation

## 2022-09-14 DIAGNOSIS — Z79899 Other long term (current) drug therapy: Secondary | ICD-10-CM | POA: Insufficient documentation

## 2022-09-14 DIAGNOSIS — I10 Essential (primary) hypertension: Secondary | ICD-10-CM | POA: Diagnosis not present

## 2022-09-14 DIAGNOSIS — K529 Noninfective gastroenteritis and colitis, unspecified: Secondary | ICD-10-CM | POA: Diagnosis not present

## 2022-09-14 DIAGNOSIS — R42 Dizziness and giddiness: Secondary | ICD-10-CM | POA: Diagnosis not present

## 2022-09-14 DIAGNOSIS — R112 Nausea with vomiting, unspecified: Secondary | ICD-10-CM | POA: Diagnosis not present

## 2022-09-14 DIAGNOSIS — R109 Unspecified abdominal pain: Secondary | ICD-10-CM | POA: Diagnosis not present

## 2022-09-14 DIAGNOSIS — R1110 Vomiting, unspecified: Secondary | ICD-10-CM | POA: Diagnosis not present

## 2022-09-14 LAB — CBC WITH DIFFERENTIAL/PLATELET
Abs Immature Granulocytes: 0.02 10*3/uL (ref 0.00–0.07)
Basophils Absolute: 0 10*3/uL (ref 0.0–0.1)
Basophils Relative: 0 %
Eosinophils Absolute: 0 10*3/uL (ref 0.0–0.5)
Eosinophils Relative: 0 %
HCT: 45.9 % (ref 39.0–52.0)
Hemoglobin: 15.2 g/dL (ref 13.0–17.0)
Immature Granulocytes: 0 %
Lymphocytes Relative: 32 %
Lymphs Abs: 2.2 10*3/uL (ref 0.7–4.0)
MCH: 28.7 pg (ref 26.0–34.0)
MCHC: 33.1 g/dL (ref 30.0–36.0)
MCV: 86.6 fL (ref 80.0–100.0)
Monocytes Absolute: 0.5 10*3/uL (ref 0.1–1.0)
Monocytes Relative: 8 %
Neutro Abs: 4.1 10*3/uL (ref 1.7–7.7)
Neutrophils Relative %: 60 %
Platelets: 419 10*3/uL — ABNORMAL HIGH (ref 150–400)
RBC: 5.3 MIL/uL (ref 4.22–5.81)
RDW: 13.5 % (ref 11.5–15.5)
WBC: 6.8 10*3/uL (ref 4.0–10.5)
nRBC: 0 % (ref 0.0–0.2)

## 2022-09-14 LAB — LIPASE, BLOOD: Lipase: 26 U/L (ref 11–51)

## 2022-09-14 LAB — RESP PANEL BY RT-PCR (RSV, FLU A&B, COVID)  RVPGX2
Influenza A by PCR: NEGATIVE
Influenza B by PCR: NEGATIVE
Resp Syncytial Virus by PCR: NEGATIVE
SARS Coronavirus 2 by RT PCR: NEGATIVE

## 2022-09-14 LAB — COMPREHENSIVE METABOLIC PANEL
ALT: 18 U/L (ref 0–44)
AST: 19 U/L (ref 15–41)
Albumin: 3.5 g/dL (ref 3.5–5.0)
Alkaline Phosphatase: 98 U/L (ref 38–126)
Anion gap: 11 (ref 5–15)
BUN: 19 mg/dL (ref 6–20)
CO2: 24 mmol/L (ref 22–32)
Calcium: 9.1 mg/dL (ref 8.9–10.3)
Chloride: 101 mmol/L (ref 98–111)
Creatinine, Ser: 1.32 mg/dL — ABNORMAL HIGH (ref 0.61–1.24)
GFR, Estimated: >60 mL/min (ref >60)
Glucose, Bld: 92 mg/dL (ref 70–99)
Potassium: 4.2 mmol/L (ref 3.5–5.1)
Sodium: 136 mmol/L (ref 135–145)
Total Bilirubin: 0.6 mg/dL (ref 0.3–1.2)
Total Protein: 7.7 g/dL (ref 6.5–8.1)

## 2022-09-14 MED ORDER — HYDRALAZINE HCL 20 MG/ML IJ SOLN
5.0000 mg | Freq: Once | INTRAMUSCULAR | Status: AC
  Administered 2022-09-14: 5 mg via INTRAVENOUS
  Filled 2022-09-14: qty 1

## 2022-09-14 MED ORDER — SODIUM CHLORIDE (PF) 0.9 % IJ SOLN
INTRAMUSCULAR | Status: AC
  Filled 2022-09-14: qty 50

## 2022-09-14 MED ORDER — HYDROMORPHONE HCL 1 MG/ML IJ SOLN
0.5000 mg | Freq: Once | INTRAMUSCULAR | Status: AC
  Administered 2022-09-14: 0.5 mg via INTRAVENOUS
  Filled 2022-09-14: qty 1

## 2022-09-14 MED ORDER — LACTATED RINGERS IV SOLN: INTRAVENOUS | Status: DC

## 2022-09-14 MED ORDER — ONDANSETRON HCL 4 MG/2ML IJ SOLN
4.0000 mg | Freq: Once | INTRAMUSCULAR | Status: AC
  Administered 2022-09-14: 4 mg via INTRAVENOUS
  Filled 2022-09-14: qty 2

## 2022-09-14 MED ORDER — IOHEXOL 300 MG/ML  SOLN
100.0000 mL | Freq: Once | INTRAMUSCULAR | Status: AC | PRN
  Administered 2022-09-14: 100 mL via INTRAVENOUS

## 2022-09-14 MED ORDER — AMLODIPINE BESYLATE 5 MG PO TABS: 5.0000 mg | ORAL_TABLET | Freq: Every day | ORAL | 0 refills | Status: DC

## 2022-09-14 MED ORDER — LACTATED RINGERS IV BOLUS
1000.0000 mL | Freq: Once | INTRAVENOUS | Status: AC
  Administered 2022-09-14: 1000 mL via INTRAVENOUS

## 2022-09-14 NOTE — ED Notes (Signed)
Pt to ct via stretcher

## 2022-09-14 NOTE — ED Provider Notes (Signed)
[facility] Provider Note   CSN: [id number] Arrival date & time: 09/14/22  1217     History  Chief Complaint  Patient presents with   Nausea   Emesis   Diarrhea    Roger Wright is a 42 y.o. male.  42 year old male presents with 3 days of nausea vomiting diarrhea as well as right lower quadrant abdominal pain.  Symptoms started with nausea vomiting that developed into watery diarrhea.  Abdominal pain is characterized as sharp and worse with any movement.  No prior history of same.  Denies any urinary symptoms.  No treatment use for this prior to arrival.  Patient also of note has been out of his antihypertensives.     Home Medications Prior to Admission medications   Medication Sig Start Date End Date Taking? Authorizing Provider  amLODipine (NORVASC) 5 MG tablet Take 1 tablet (5 mg total) by mouth daily. 05/22/22   Carlisle Cater, PA-C      Allergies    Patient has no known allergies.    Review of Systems   Review of Systems  All other systems reviewed and are negative.   Physical Exam Updated Vital Signs BP (!) 205/111 (BP Location: Right Arm)   Pulse 96   Temp 98.6 F (37 C) (Oral)   Resp 18   Ht 1.753 m (5\' 9" )   Wt 77.1 kg   SpO2 100%   BMI 25.10 kg/m  Physical Exam Vitals and nursing note reviewed.  Constitutional:      General: He is not in acute distress.    Appearance: Normal appearance. He is well-developed. He is not toxic-appearing.  HENT:     Head: Normocephalic and atraumatic.  Eyes:     General: Lids are normal.     Conjunctiva/sclera: Conjunctivae normal.     Pupils: Pupils are equal, round, and reactive to light.  Neck:     Thyroid: No thyroid mass.     Trachea: No tracheal deviation.  Cardiovascular:     Rate and Rhythm: Normal rate and regular rhythm.     Heart sounds: Normal heart sounds. No murmur heard.    No gallop.  Pulmonary:     Effort: Pulmonary effort is normal. No respiratory  distress.     Breath sounds: Normal breath sounds. No stridor. No decreased breath sounds, wheezing, rhonchi or rales.  Abdominal:     General: There is no distension.     Palpations: Abdomen is soft.     Tenderness: There is abdominal tenderness in the right lower quadrant. There is guarding. There is no rebound.    Musculoskeletal:        General: No tenderness. Normal range of motion.     Cervical back: Normal range of motion and neck supple.  Skin:    General: Skin is warm and dry.     Findings: No abrasion or rash.  Neurological:     Mental Status: He is alert and oriented to person, place, and time. Mental status is at baseline.     GCS: GCS eye subscore is 4. GCS verbal subscore is 5. GCS motor subscore is 6.     Cranial Nerves: No cranial nerve deficit.     Sensory: No sensory deficit.     Motor: Motor function is intact.  Psychiatric:        Attention and Perception: Attention normal.        Speech: Speech normal.  Behavior: Behavior normal.    ED Results / Procedures / Treatments   Labs (all labs ordered are listed, but only abnormal results are displayed) Labs Reviewed  CBC WITH DIFFERENTIAL/PLATELET  LIPASE, BLOOD  COMPREHENSIVE METABOLIC PANEL    EKG None  Radiology No results found.  Procedures Procedures    Medications Ordered in ED Medications  lactated ringers bolus 1,000 mL (has no administration in time range)  lactated ringers infusion (has no administration in time range)  ondansetron (ZOFRAN) injection 4 mg (has no administration in time range)  HYDROmorphone (DILAUDID) injection 0.5 mg (has no administration in time range)    ED Course/ Medical Decision Making/ A&P                             Medical Decision Making Amount and/or Complexity of Data Reviewed Labs: ordered. Radiology: ordered.  Risk Prescription drug management.   Patient hypertensive here but has been with antihypertensive medications for some time and was  given hydralazine blood pressure improved.  Concern for possible mental status abdominal CT per interpretation showed no acute finding consistent with appendicitis.  It is consistent with an infectious enteritis.  Patient feels better here after antiemetics.  Will discharge home.  Patient has been on amlodipine before in her past and will restart.  Will give primary care referral        Final Clinical Impression(s) / ED Diagnoses Final diagnoses:  None    Rx / DC Orders ED Discharge Orders     None         Lacretia Leigh, MD 09/14/22 1505

## 2022-09-14 NOTE — ED Notes (Signed)
Pt back from ct

## 2022-09-14 NOTE — ED Notes (Signed)
ED Provider at bedside. 

## 2022-09-14 NOTE — ED Triage Notes (Signed)
BIB EMS for N/V/D for 3 days. No relief with intervention at home. C/o RLQ pain

## 2022-09-14 NOTE — ED Notes (Signed)
Dr. Zenia Resides at bedside. MD notified of pt needing refill for BP medication. Pt has not had medication for a month

## 2022-10-26 ENCOUNTER — Encounter: Payer: Self-pay | Admitting: Internal Medicine

## 2022-10-26 ENCOUNTER — Telehealth: Payer: Self-pay

## 2022-10-26 ENCOUNTER — Ambulatory Visit: Payer: 59 | Attending: Internal Medicine | Admitting: Internal Medicine

## 2022-10-26 VITALS — BP 133/83 | HR 82 | Temp 98.3°F | Ht 69.0 in | Wt 178.0 lb

## 2022-10-26 DIAGNOSIS — I10 Essential (primary) hypertension: Secondary | ICD-10-CM

## 2022-10-26 DIAGNOSIS — Z87891 Personal history of nicotine dependence: Secondary | ICD-10-CM

## 2022-10-26 DIAGNOSIS — R590 Localized enlarged lymph nodes: Secondary | ICD-10-CM | POA: Diagnosis not present

## 2022-10-26 DIAGNOSIS — Z7252 High risk homosexual behavior: Secondary | ICD-10-CM | POA: Diagnosis not present

## 2022-10-26 DIAGNOSIS — F1911 Other psychoactive substance abuse, in remission: Secondary | ICD-10-CM | POA: Diagnosis not present

## 2022-10-26 DIAGNOSIS — F1021 Alcohol dependence, in remission: Secondary | ICD-10-CM | POA: Diagnosis not present

## 2022-10-26 DIAGNOSIS — Z7689 Persons encountering health services in other specified circumstances: Secondary | ICD-10-CM

## 2022-10-26 DIAGNOSIS — Z8619 Personal history of other infectious and parasitic diseases: Secondary | ICD-10-CM | POA: Diagnosis not present

## 2022-10-26 NOTE — Progress Notes (Signed)
Patient ID: Roger Wright, male    DOB: March 31, 1981  MRN: [id number]  CC: Follow-up [facility] f/u.  /No to flu vax. )   Subjective: Roger Wright is a 42 y.o. male who presents for new pt visit.  Lisette Abu, a fellow resident from his living facility is with him initially.  Patient asked him to leave the room when I started taking his history. His concerns today include:  Patient with history of  HTN, tob dep, monkeypox (03/2021 managed by ID), syphilis 03/2021  Patient was seen in the emergency room 09/14/2022 for acute gastroenteritis.  Symptoms have since resolved.  Hx of HTN.  Was on Norvasc 5 mg but self d/c 3 wks ago due to S.E -Lethargic, sleepy, irritable.   He adds table salt to foods.  Has increased fruits and veggies intake over past 2 mths.  Loves cakes and pies.  Drinks sweet tea, water, juices.  Was seeing ID in 2022 for Monkey Pox and f/u syphilis.  Patient reports receiving treatment for syphilis during his hospitalization 03/2021.  He also endorses history of hepatitis B.  He is not sure whether it had cleared spontaneously. Patient has sex only with men.  He does not use condoms consistently.  He has not been sexually active since being in the facility where he is currently residing for the past 2 months.  Started on PrEP by ID.  Received 1 month of Descovy and was supposed to be change to an injectable form but patient states he never got it.     Gives history of heavy alcohol use.  Quit drinking 1 yr ago Quit street drugs 1-2 mths ago - meth and cocaine, marijuana.  Now in sober living house. Quit cigarettes 30 days ago.  Smoked for 20 yrs.  He was 1 pk a day smoker.     HM:  declines flu vaccine.  Thinks he had Tdapt within the past 10 yrs, not sure.   Patient Active Problem List   Diagnosis Date Noted   Essential hypertension 04/03/2021   Pharyngitis 04/01/2021   Epiglottitis 04/01/2021   Acute hepatitis B    Transaminitis 12/30/2014   Acute hepatitis 12/30/2014    Alcohol intoxication ([place])      No current outpatient medications on file prior to visit.   No current facility-administered medications on file prior to visit.    Allergies  Allergen Reactions   Norvasc [Amlodipine] Other (See Comments)    Makes him lethargic and irritable    Social History   Socioeconomic History   Marital status: Single    Spouse name: Not on file   Number of children: Not on file   Years of education: Not on file   Highest education level: Not on file  Occupational History   Not on file  Tobacco Use   Smoking status: Former    Types: Cigarettes    Quit date: 09/27/2022    Years since quitting: 0.0   Smokeless tobacco: Never  Vaping Use   Vaping Use: Former  Substance and Sexual Activity   Alcohol use: Not Currently    Comment: my drinking is inconsistant, It used to be very heavy  "   Drug use: Yes    Types: Marijuana, Methamphetamines, Cocaine    Comment: 10/7- 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; last use of meth and cocaine 1 wk ago   Sexual activity: Yes    Birth control/protection: Condom  Other Topics Concern   Not on file  Social History Narrative   Not on file   Social Determinants of Health   Financial Resource Strain: Not on file  Food Insecurity: Not on file  Transportation Needs: Not on file  Physical Activity: Not on file  Stress: Not on file  Social Connections: Not on file  Intimate Partner Violence: Not on file    Family History  Problem Relation Age of Onset   High blood pressure Mother    Diabetes Maternal Grandmother    High blood pressure Maternal Grandmother    Atrial fibrillation Maternal Grandmother     Past Surgical History:  Procedure Laterality Date   HERNIA REPAIR      ROS: Review of Systems  Constitutional:        Denies any fever, night sweats, unexplained weight changes.   Negative except as stated above  PHYSICAL EXAM: BP 133/83 (BP Location: Left Arm, Patient Position: Sitting, Cuff Size:  Normal)   Pulse 82   Temp 98.3 F (36.8 C) (Oral)   Ht '5\' 9"'$  (1.753 m)   Wt 178 lb (80.7 kg)   SpO2 99%   BMI 26.29 kg/m   Wt Readings from Last 3 Encounters:  10/26/22 178 lb (80.7 kg)  09/14/22 170 lb (77.1 kg)  05/22/22 181 lb (82.1 kg)   BP 120/90 Physical Exam   General appearance - alert, well appearing, middle-aged African-American male and in no distress Mental status - normal mood, behavior, speech, dress, motor activity, and thought processes Mouth - mucous membranes moist, pharynx normal without lesions Neck -2 cm movable lymph node on the left side of the neck laterally.  No other cervical lymphadenopathy appreciated.  No axillary lymphadenopathy. Heart - normal rate, regular rhythm, normal S1, S2, no murmurs, rubs, clicks or gallops Abdomen - soft, nontender, nondistended, no masses or organomegaly Extremities - peripheral pulses normal, no pedal edema, no clubbing or cyanosis    10/26/2022    8:48 AM  Depression screen PHQ 2/9  Decreased Interest 1  Down, Depressed, Hopeless 0  PHQ - 2 Score 1  Altered sleeping 1  Tired, decreased energy 0  Change in appetite 0  Feeling bad or failure about yourself  0  Trouble concentrating 0  Moving slowly or fidgety/restless 0  Suicidal thoughts 0  PHQ-9 Score 2   .gad    Latest Ref Rng & Units 09/14/2022   12:42 PM 05/22/2022    2:18 PM 04/02/2021    8:46 AM  CMP  Glucose 70 - 99 mg/dL 92  95  214   BUN 6 - 20 mg/dL '19  15  15   '$ Creatinine 0.61 - 1.24 mg/dL 1.32  1.13  1.15   Sodium 135 - 145 mmol/L 136  137  137   Potassium 3.5 - 5.1 mmol/L 4.2  4.2  4.3   Chloride 98 - 111 mmol/L 101  106  102   CO2 22 - 32 mmol/L '24  25  22   '$ Calcium 8.9 - 10.3 mg/dL 9.1  9.0  9.4   Total Protein 6.5 - 8.1 g/dL 7.7     Total Bilirubin 0.3 - 1.2 mg/dL 0.6     Alkaline Phos 38 - 126 U/L 98     AST 15 - 41 U/L 19     ALT 0 - 44 U/L 18      Lipid Panel  No results found for: "CHOL", "TRIG", "HDL", "CHOLHDL", "VLDL",  "LDLCALC", "LDLDIRECT"  CBC    Component Value Date/Time  WBC 6.8 09/14/2022 1242   RBC 5.30 09/14/2022 1242   HGB 15.2 09/14/2022 1242   HCT 45.9 09/14/2022 1242   PLT 419 (H) 09/14/2022 1242   MCV 86.6 09/14/2022 1242   MCH 28.7 09/14/2022 1242   MCHC 33.1 09/14/2022 1242   RDW 13.5 09/14/2022 1242   LYMPHSABS 2.2 09/14/2022 1242   MONOABS 0.5 09/14/2022 1242   EOSABS 0.0 09/14/2022 1242   BASOSABS 0.0 09/14/2022 1242    ASSESSMENT AND PLAN: 1. Establishing care with new doctor, encounter for  2. Essential hypertension Repeat blood pressure revealed elevation in DBP.  Patient self discontinued Norvasc due to side effects.  We discussed DASH diet.  He would like to see whether his blood pressure normalizes with changes in eating habits.  We will bring him back in several weeks to recheck.  3. Cervical lymphadenopathy Patient has 1 large lymph node noted on the left side of the neck.  He tells me it has been there from when he was diagnosed with monkeypox and has decreased in size since then.  No other cervical or axillary lymphadenopathy appreciated.  Since it is decreasing in size, and patient has no fever, night sweats, unexplained weight loss I think it is reasonable to observe and recheck on f/u visit.  If still present, may consider CT neck and chest - HIV antibody (with reflex)  4. Former smoker Commended him on quitting.  Encouraged to remain free of cigarettes.  5. Substance abuse in remission ([place]) 6. Alcohol use disorder, severe, in sustained remission ([place]) Commended him on quitting.  He is in a clean house  7. History of syphilis Will have him follow-up with ID for titer check. - Ambulatory referral to Infectious Disease  8. High risk homosexual behavior Discussed on encourage safe sex practices and use of condoms consistently. - Hepatitis C Antibody - HIV antibody (with reflex) - Ambulatory referral to Infectious Disease  9. History of hepatitis B -  Hepatitis B surface antigen - Hepatitis B DNA, Ultraquantitative, PCR - Ambulatory referral to Infectious Disease    Patient was given the opportunity to ask questions.  Patient verbalized understanding of the plan and was able to repeat key elements of the plan.   This documentation was completed using Radio producer.  Any transcriptional errors are unintentional.  Orders Placed This Encounter  Procedures   Hepatitis C Antibody   Hepatitis B surface antigen   Hepatitis B DNA, Ultraquantitative, PCR   HIV antibody (with reflex)   Ambulatory referral to Infectious Disease     Requested Prescriptions    No prescriptions requested or ordered in this encounter    Return in about 6 weeks (around 12/07/2022).  Karle Plumber, MD, FACP

## 2022-10-26 NOTE — Telephone Encounter (Signed)
Appointment obtained for patient with infectious disease for 11/02/2022 at 2:30 pm as directed by PCP. Patient aware of the appointment.

## 2022-10-27 ENCOUNTER — Telehealth: Payer: Self-pay | Admitting: Infectious Disease

## 2022-10-27 NOTE — Telephone Encounter (Signed)
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  He was previously on PrEP in our clinic but had fallen out of care.  Can we bring him into clinic to get him engaged into care

## 2022-10-28 LAB — HEPATITIS B DNA, ULTRAQUANTITATIVE, PCR: HBV DNA SERPL PCR-ACNC: NOT DETECTED IU/mL

## 2022-10-28 LAB — HEPATITIS B SURFACE ANTIGEN: Hepatitis B Surface Ag: NEGATIVE

## 2022-10-28 LAB — HEPATITIS C ANTIBODY: Hep C Virus Ab: NONREACTIVE

## 2022-10-28 LAB — HIV ANTIBODY (ROUTINE TESTING W REFLEX): HIV Screen 4th Generation wRfx: REACTIVE

## 2022-10-28 LAB — HIV 1/2 AB DIFFERENTIATION
HIV 1 Ab: REACTIVE
HIV 2 Ab: NONREACTIVE
NOTE (HIV CONF MULTIP: POSITIVE — AB

## 2022-10-28 NOTE — Telephone Encounter (Signed)
Call placed to speak with patient. Patient is not at home message left to have him call office.

## 2022-10-29 ENCOUNTER — Telehealth: Payer: Self-pay | Admitting: Internal Medicine

## 2022-10-29 NOTE — Telephone Encounter (Signed)
Phone call placed to patient this morning.  Patient initially came on the phone on a speaker with the director of the home on the line with him.  I requested that this be a private conversation between myself and the patient and patient was agreeable to that.  He was taken off speakerphone.  I informed the patient that his HIV test came back positive.  Screen for hepatitis B and C was negative.  Informed him that I have already touch base with infectious disease specialist.  His appointment has been changed with them to 11/11/2022 at 2 PM.  Patient was able to write down this appointment and states he will be there.  I asked whether he had any questions, patient said no.

## 2022-11-02 ENCOUNTER — Telehealth: Payer: Self-pay | Admitting: Emergency Medicine

## 2022-11-02 ENCOUNTER — Ambulatory Visit: Payer: 59 | Admitting: Pharmacist

## 2022-11-02 NOTE — Telephone Encounter (Signed)
Copied from [place] [phone]. Topic: General - Other >> Nov 02, 2022  9:29 AM Everette C wrote: Reason for CRM: Brooks with the [place] would ike to be contacted by a member of clinical staff when possible to follow up on the care and condition of the patient   Please contact when possible

## 2022-11-04 NOTE — Telephone Encounter (Signed)
Called & spoke to [place] with the [place]. Rolena Infante inquired about patient's placement for primary care and infectious disease. Clarified that Dr.Johnson is his primary care doctor and was referred to [facility] for infectious disease management. Brooks expressed verbal understanding. No further questions at this time.

## 2022-11-08 ENCOUNTER — Telehealth: Payer: Self-pay

## 2022-11-08 ENCOUNTER — Other Ambulatory Visit (HOSPITAL_COMMUNITY): Payer: Self-pay

## 2022-11-08 NOTE — Telephone Encounter (Signed)
[facility] Patient Teacher, English as a foreign language completed.    The patient is insured through [employer] and has a $3146.00 copay.  Patient may need to sign up for RW  We will continue to follow to see if copay assistance is needed.  Ileene Patrick, [facility] Patient [facility] Phone: [phone] Fax:  [phone]

## 2022-11-11 ENCOUNTER — Ambulatory Visit: Payer: 59 | Admitting: Pharmacist

## 2022-11-11 ENCOUNTER — Ambulatory Visit: Payer: 59 | Admitting: Internal Medicine

## 2022-12-10 ENCOUNTER — Ambulatory Visit: Payer: 59 | Admitting: Internal Medicine

## 2022-12-10 ENCOUNTER — Telehealth: Payer: Self-pay | Admitting: Internal Medicine

## 2022-12-13 NOTE — Telephone Encounter (Signed)
Spoke with Shon Baton at [employer], she has been attempting to reach the patient as well. She got in touch with his mother who reports Jovanni is "somewhat homeless." She is not sure where he is, but provided an additional phone number. Contact info updated. Shon Baton has been unsuccessful in reaching Abdalrahman at this new number.   He was previously staying at [facility], but is no longer residing there. DIS aware that patient has no-showed appointments with both PCP and [facility].   [name], RN

## 2023-02-16 ENCOUNTER — Telehealth: Payer: Self-pay

## 2023-02-16 NOTE — Telephone Encounter (Signed)
Roger Wright was diagnosed with HIV 10/2022 but did not make it to ID follow up appointment. Called today to offer another appointment, call could not be completed on mobile number. Home number voicemail greeting did not belong to patient, did not leave a message.   [name], RN

## 2023-04-15 NOTE — Progress Notes (Signed)
[facility] and PCP have been unsuccessful in reaching patient. Referral placed to [facility] to aid in placing referral to state bridge counselor to initiate HIV care.   [name], RN

## 2023-07-08 ENCOUNTER — Encounter (HOSPITAL_BASED_OUTPATIENT_CLINIC_OR_DEPARTMENT_OTHER): Payer: Self-pay | Admitting: Emergency Medicine

## 2023-07-08 ENCOUNTER — Other Ambulatory Visit: Payer: Self-pay

## 2023-07-08 ENCOUNTER — Emergency Department (HOSPITAL_BASED_OUTPATIENT_CLINIC_OR_DEPARTMENT_OTHER)
Admission: EM | Admit: 2023-07-08 | Discharge: 2023-07-08 | Disposition: A | Discharge status: Home / Self Care | Payer: 59 | Attending: Emergency Medicine | Admitting: Emergency Medicine

## 2023-07-08 DIAGNOSIS — K0889 Other specified disorders of teeth and supporting structures: Secondary | ICD-10-CM | POA: Insufficient documentation

## 2023-07-08 DIAGNOSIS — I10 Essential (primary) hypertension: Secondary | ICD-10-CM | POA: Diagnosis not present

## 2023-07-08 DIAGNOSIS — Z79899 Other long term (current) drug therapy: Secondary | ICD-10-CM | POA: Insufficient documentation

## 2023-07-08 MED ORDER — BENAZEPRIL HCL 10 MG PO TABS: 10.0000 mg | ORAL_TABLET | Freq: Every day | ORAL | Status: DC

## 2023-07-08 MED ORDER — AMOXICILLIN-POT CLAVULANATE 875-125 MG PO TABS: 1.0000 | ORAL_TABLET | Freq: Two times a day (BID) | ORAL | 0 refills | Status: DC

## 2023-07-08 MED ORDER — BENAZEPRIL HCL 10 MG PO TABS: 10.0000 mg | ORAL_TABLET | Freq: Every day | ORAL | 1 refills | Status: DC

## 2023-07-08 MED ORDER — LIDOCAINE HCL (PF) 1 % IJ SOLN
5.0000 mL | Freq: Once | INTRAMUSCULAR | Status: AC
  Administered 2023-07-08: 5 mL
  Filled 2023-07-08: qty 5

## 2023-07-08 MED ORDER — HYDRALAZINE HCL 25 MG PO TABS
25.0000 mg | ORAL_TABLET | Freq: Once | ORAL | Status: AC
  Administered 2023-07-08: 25 mg via ORAL
  Filled 2023-07-08: qty 1

## 2023-07-08 MED ORDER — ACETAMINOPHEN 325 MG PO TABS
650.0000 mg | ORAL_TABLET | Freq: Once | ORAL | Status: AC
  Administered 2023-07-08: 650 mg via ORAL
  Filled 2023-07-08: qty 2

## 2023-07-08 NOTE — ED Provider Notes (Signed)
[place] [facility] Provider Note   CSN: [id number] Arrival date & time: 07/08/23  1523     History  Chief Complaint  Patient presents with   Dental Pain    Roger Wright is a 42 y.o. male with past medical history seen for alcohol intoxication, hypertension, hepatitis B who presents concern for dental pain on the left upper mouth for around 2 days.  He reports he has been not taking his blood pressure medication for a month, but denies any chest pain, headaches, numbness, tingling, he reports that he did not like the way the blood pressure medication made him feel and does not want to be on it.  He does not have a primary care doctor or dentist.  He denies any difficulty swallowing, neck swelling.   Dental Pain      Home Medications Prior to Admission medications   Medication Sig Start Date End Date Taking? Authorizing Provider  amoxicillin-clavulanate (AUGMENTIN) 875-125 MG tablet Take 1 tablet by mouth every 12 (twelve) hours. 07/08/23  Yes Anuar Walgren H, PA-C  benazepril (LOTENSIN) 10 MG tablet Take 1 tablet (10 mg total) by mouth daily. 07/08/23  Yes Shalin Linders H, PA-C      Allergies    Norvasc [amlodipine]    Review of Systems   Review of Systems  All other systems reviewed and are negative.   Physical Exam Updated Vital Signs BP (!) 187/130 (BP Location: Left Arm)   Pulse (!) 117   Temp 99.6 F (37.6 C) (Oral)   Resp 16   Ht 5\' 9"  (1.753 m)   Wt 74.8 kg   SpO2 100%   BMI 24.37 kg/m  Physical Exam Vitals and nursing note reviewed.  Constitutional:      General: He is not in acute distress.    Appearance: Normal appearance.  HENT:     Head: Normocephalic and atraumatic.     Mouth/Throat:     Comments: Patient with discrete abscess on the upper left roof of mouth, some gum swelling, redness, no active drainage during exam Eyes:     General:        Right eye: No discharge.        Left eye: No  discharge.  Neck:     Comments: No soft tissue swelling of neck, or induration Cardiovascular:     Rate and Rhythm: Normal rate and regular rhythm.  Pulmonary:     Effort: Pulmonary effort is normal. No respiratory distress.  Musculoskeletal:        General: No deformity.  Lymphadenopathy:     Cervical: No cervical adenopathy.  Skin:    General: Skin is warm and dry.  Neurological:     Mental Status: He is alert and oriented to person, place, and time.  Psychiatric:        Mood and Affect: Mood normal.        Behavior: Behavior normal.     ED Results / Procedures / Treatments   Labs (all labs ordered are listed, but only abnormal results are displayed) Labs Reviewed - No data to display  EKG None  Radiology No results found.  Procedures .Marland KitchenIncision and Drainage  Date/Time: 07/08/2023 5:19 PM  Performed by: Olene Floss, PA-C Authorized by: Olene Floss, PA-C   Consent:    Consent obtained:  Verbal   Consent given by:  Patient   Risks, benefits, and alternatives were discussed: yes     Risks discussed:  Bleeding, incomplete drainage, pain, infection and damage to other organs   Alternatives discussed:  No treatment Universal protocol:    Procedure explained and questions answered to patient or proxy's satisfaction: yes     Patient identity confirmed:  Verbally with patient Location:    Type:  Abscess   Size:  2cm   Location:  Mouth   Mouth location:  Palate Pre-procedure details:    Skin preparation:  Antiseptic wash Sedation:    Sedation type:  None Anesthesia:    Anesthesia method:  Local infiltration   Local anesthetic:  Lidocaine 1% w/o epi Procedure type:    Complexity:  Simple Procedure details:    Incision types:  Stab incision   Incision depth:  Submucosal   Drainage:  Bloody and purulent   Drainage amount:  Scant   Wound treatment:  Wound left open   Packing materials:  None Post-procedure details:    Procedure completion:   Tolerated     Medications Ordered in ED Medications  hydrALAZINE (APRESOLINE) tablet 25 mg (has no administration in time range)  acetaminophen (TYLENOL) tablet 650 mg (650 mg Oral Given 07/08/23 1703)  lidocaine (PF) (XYLOCAINE) 1 % injection 5 mL (5 mLs Other Given 07/08/23 1703)    ED Course/ Medical Decision Making/ A&P                                 Medical Decision Making   This an overall well-appearing 42 y.o. male who presents with concern for dental pain.  Physical exam reveals cavities, gum abscess.  Patient with redness, gum swelling without evidence of periapical abscess, PTA, uvula deviation, pharyngitis, epiglottitis, dysphonia, stridor. Gum abscess noted in upper left, no active drainage. Drained in ED as discussed above.  Patient without difficulty swallowing.  Borderline fever, no chills. He does have some tachycardia and poorly controlled BP at 187/130. discussed this is quite elevated, and worrisome, recommended further workup for hypertension including EKG, blood work, troponin, and discussed giving blood pressure medications to control, patient declines at this time, reporting he does not like the way the blood pressure medications make him feel and he will follow-up to discuss this further with his primary care doctor.  He does acquiesce to blood pressure control in the ED and medicine to go home on.  Given patient's symptoms think it is reasonable to treat with antibiotics.  Encouraged ibuprofen, Tylenol, Orajel, ice for pain control. Encouraged urgent dental follow-up, dental resource guide provided.  Provided Augmentin for antibiotic coverage.  Patient discharged in stable condition at this time, return precautions given. Final Clinical Impression(s) / ED Diagnoses Final diagnoses:  Pain, dental  Uncontrolled hypertension    Rx / DC Orders ED Discharge Orders          Ordered    amoxicillin-clavulanate (AUGMENTIN) 875-125 MG tablet  Every 12 hours         07/08/23 1718    benazepril (LOTENSIN) 10 MG tablet  Daily        07/08/23 1718              Tahji Concrete, Harrel Carina, PA-C 07/08/23 1720    Glyn Ade, MD 07/08/23 2145

## 2023-07-08 NOTE — ED Triage Notes (Signed)
Pt c/o dental pain (LT upper) and pain to roof of mouth x 2d; out of BP meds x months

## 2023-07-08 NOTE — Discharge Instructions (Addendum)
I prescribed a course of antibiotics as well as a new blood pressure medication.  I recommend that you follow-up closely with your primary care doctor regarding your blood pressure as it was very poorly controlled in the ED today, I think that it could be slightly because of the dental infection that you are having, nonetheless considering you were previously on blood pressure medications but stopped taking them it is important that you take something.  I have given you a 41-month supply of the 1 that I have provided, please follow-up with your primary care doctor to ensure refill.  I recommend that you follow-up closely with a dentist, provided some dental resources as above

## 2023-10-11 ENCOUNTER — Telehealth: Payer: Self-pay

## 2023-10-11 NOTE — Telephone Encounter (Signed)
 Called patient to offer appointment. He was diagnosed with HIV March 2024 but has not established care. No answer. Left HIPAA compliant voicemail requesting callback.   [name], RN

## 2024-04-10 ENCOUNTER — Other Ambulatory Visit (HOSPITAL_BASED_OUTPATIENT_CLINIC_OR_DEPARTMENT_OTHER): Payer: Self-pay

## 2024-04-10 ENCOUNTER — Emergency Department (HOSPITAL_BASED_OUTPATIENT_CLINIC_OR_DEPARTMENT_OTHER): Payer: MEDICAID

## 2024-04-10 ENCOUNTER — Emergency Department (HOSPITAL_BASED_OUTPATIENT_CLINIC_OR_DEPARTMENT_OTHER)
Admission: EM | Admit: 2024-04-10 | Discharge: 2024-04-10 | Disposition: A | Discharge status: Home / Self Care | Payer: MEDICAID | Attending: Emergency Medicine | Admitting: Emergency Medicine

## 2024-04-10 ENCOUNTER — Other Ambulatory Visit: Payer: Self-pay

## 2024-04-10 ENCOUNTER — Encounter (HOSPITAL_BASED_OUTPATIENT_CLINIC_OR_DEPARTMENT_OTHER): Payer: Self-pay

## 2024-04-10 DIAGNOSIS — R2242 Localized swelling, mass and lump, left lower limb: Secondary | ICD-10-CM

## 2024-04-10 DIAGNOSIS — F17210 Nicotine dependence, cigarettes, uncomplicated: Secondary | ICD-10-CM | POA: Diagnosis not present

## 2024-04-10 DIAGNOSIS — I10 Essential (primary) hypertension: Secondary | ICD-10-CM | POA: Diagnosis not present

## 2024-04-10 DIAGNOSIS — R600 Localized edema: Secondary | ICD-10-CM | POA: Insufficient documentation

## 2024-04-10 MED ORDER — BENAZEPRIL HCL 10 MG PO TABS
10.0000 mg | ORAL_TABLET | Freq: Every day | ORAL | 1 refills | Status: AC
  Filled 2024-04-10: qty 30, 30d supply, fill #0

## 2024-04-10 MED ORDER — CEFADROXIL 500 MG PO CAPS
500.0000 mg | ORAL_CAPSULE | Freq: Two times a day (BID) | ORAL | 0 refills | Status: AC
  Filled 2024-04-10: qty 10, 5d supply, fill #0

## 2024-04-10 MED ORDER — CELECOXIB 200 MG PO CAPS
200.0000 mg | ORAL_CAPSULE | Freq: Two times a day (BID) | ORAL | 0 refills | Status: AC | PRN
  Filled 2024-04-10: qty 30, 15d supply, fill #0

## 2024-04-10 NOTE — ED Notes (Signed)
 Provider at the bedside.

## 2024-04-10 NOTE — ED Provider Notes (Signed)
 [place] [facility] [place]   CSN: [id number] Arrival date & time: 04/10/24  9059     Patient presents with: No chief complaint on file.   Roger Wright is a 43 y.o. male.   HPI   43 year old male presents to the emergency department for evaluation of left ankle/leg swelling.  States that he was walking 2 weeks ago and hit the inside of his left ankle on a fridge.  States initially caused some pain.  Iced it as well as took anti-inflammatories with some improvement.  States that around a week ago, noticed swelling to his left lower extremity from about mid shin downwards.  States that swelling has been persistent.  Denies any fevers, chills, shortness of breath, chest pain.  Does feel getting some redness to the inner aspect of his left ankle where he initially hit the fridge which he states is somewhat new.  Presents emergency department for further assessment.  Past medical history significant for nephrolithiasis  Prior to Admission medications   Medication Sig Start Date End Date Taking? Authorizing Provider  amoxicillin -clavulanate (AUGMENTIN ) 875-125 MG tablet Take 1 tablet by mouth every 12 (twelve) hours. 07/08/23   Prosperi, Christian H, PA-C  benazepril  (LOTENSIN ) 10 MG tablet Take 1 tablet (10 mg total) by mouth daily. 07/08/23   Prosperi, Sherlean H, PA-C    Allergies: Norvasc  [amlodipine ]    Review of Systems  All other systems reviewed and are negative.   Updated Vital Signs There were no vitals taken for this visit.  Physical Exam Vitals and nursing note reviewed.  Constitutional:      General: He is not in acute distress.    Appearance: He is well-developed.  HENT:     Head: Normocephalic and atraumatic.  Eyes:     Conjunctiva/sclera: Conjunctivae normal.  Cardiovascular:     Rate and Rhythm: Normal rate and regular rhythm.     Heart sounds: No murmur heard. Pulmonary:     Effort: Pulmonary effort is normal.  No respiratory distress.     Breath sounds: Normal breath sounds.  Abdominal:     Palpations: Abdomen is soft.     Tenderness: There is no abdominal tenderness.  Musculoskeletal:     Cervical back: Neck supple.     Comments: 2+ pitting edema left lower extremity from mid shin downwards.  Tender palpation left distal calf/Achilles musculature.  Tender palpation left anterior medial malleolus.  Full range of motion of left ankle as well as digits of left foot.  Pedal and posterior tibial pulses 2+ bilaterally.  Patient able to ambulate without assistance.  Skin:    General: Skin is warm and dry.     Capillary Refill: Capillary refill takes less than 2 seconds.  Neurological:     Mental Status: He is alert.  Psychiatric:        Mood and Affect: Mood normal.     (all labs ordered are listed, but only abnormal results are displayed) Labs Reviewed - No data to display  EKG: None  Radiology: No results found.   Procedures   Medications Ordered in the ED - No data to display                                  Medical Decision Making Amount and/or Complexity of Data Reviewed Radiology: ordered.  Risk Prescription drug management.   This patient presents to the ED  for concern of ankle pain, this involves an extensive number of treatment options, and is a complaint that carries with it a high risk of complications and morbidity.  The differential diagnosis includes fracture, strain/pain, dislocation, ligamentous/states injury, compromise, DVT, cellulitis, abscess, septic arthritis, other other   Co morbidities that complicate the patient evaluation  See HPI   Additional history obtained:  Additional history obtained from EMR External records from outside source obtained and reviewed including hospital records   Lab Tests:  N/a   Imaging Studies ordered:  I ordered imaging studies including left ankle x-ray, DVT ultrasound I independently visualized and interpreted  imaging which showed  Left ankle x-ray: Soft tissue swelling.  No acute osseous abnormality. DVT ultrasound: No DVT.  Left inguinal lymphadenopathy I agree with the radiologist interpretation   Cardiac Monitoring: / EKG:  N/a   Consultations Obtained:  N/a   Problem List / ED Course / Critical interventions / Medication management  Left lower extremity swelling Reevaluation of the patient after these medicines showed that the patient stayed the same I have reviewed the patients home medicines and have made adjustments as needed   Social Determinants of Health:  Current cigarette use.  Alcohol use.  Denies other substance use.   Test / Admission - Considered:  Left lower extremity swelling Vitals signs significant for hypertension; has not been taking pressure medication prescribed from prior visit; will refill patient's blood pressure medication and advised to take.. Otherwise within normal range and stable throughout visit. Imaging studies significant for: see above  43 year old male presents to the emergency department for evaluation of left ankle/leg swelling.  States that he was walking 2 weeks ago and hit the inside of his left ankle on a fridge.  States initially caused some pain.  Iced it as well as took anti-inflammatories with some improvement.  States that around a week ago, noticed swelling to his left lower extremity from about mid shin downwards.  States that swelling has been persistent.  Denies any fevers, chills, shortness of breath, chest pain.  Does feel getting some redness to the inner aspect of his left ankle where he initially hit the fridge which he states is somewhat new.  Presents emergency department for further assessment. On exam, 2+ pitting edema left lower extremity about mid tibia distally with erythematous skin changes medial aspect of left distal tibia.  No pulse deficits suggest ischemic limb.  X-ray obtained given somewhat remote history of left  ankle trauma which was negative.  DVT ultrasound was negative as well.  No evidence clinically of septic arthritis.  Patient able to range left hip fully able to bear weight and ambulate without much discomfort.  Concern for possible cellulitis given known opening of the skin from prior trauma.  Will treat empirically with antibiotics recommend symptomatic therapy as an AVS close follow-up with primary care in the outpatient setting.  Treatment plan discussed with patient.  Understanding is agreeable.  Patient well-appearing and afebrile in no acute distress upon discharge. Worrisome signs and symptoms were discussed with the patient, and the patient acknowledged understanding to return to the ED if noticed. Patient was stable upon discharge.       Final diagnoses:  None    ED Discharge Orders     None          Silver Wonda LABOR, [place] 04/10/24 1159    Cottie Donnice PARAS, MD 04/10/24 1229

## 2024-04-10 NOTE — ED Notes (Signed)
 Discharge instructions reviewed with patient. Patient verbalizes understanding, no further questions at this time. Medications/prescriptions and follow up information provided. No acute distress noted at time of departure.

## 2024-04-10 NOTE — ED Notes (Signed)
 Patient transported to US  imaging

## 2024-04-10 NOTE — ED Notes (Signed)
 Patient transported to radiology

## 2024-04-10 NOTE — Discharge Instructions (Signed)
 As discussed, x-ray of your left ankle do not show obvious bony abnormality.  Ultrasound of your leg showed no obvious blood clot in your leg but did show a enlarged lymph node.  Left inguinal region.  Given the redness of your left ankle, will place on antibiotics, concern for possible skin infection.  Will also send in anti-inflammatories.  Recommend getting compression socks at the pharmacy or on [employer] that will help with the leg swelling.  Recommend elevating your legs above the level of your heart help with swelling as well.  Recommend follow-up primary care for reassessment.

## 2024-04-10 NOTE — ED Triage Notes (Signed)
 Patient here POV from Home.  Notes ankle injury (left) from hitting against metal freezer door at work. Swelling and pain since then. RICE therapy not terribly effective.  NAD noted during Triage. A&Ox4. Gcs 15. Ambulatory.

## 2024-04-13 ENCOUNTER — Other Ambulatory Visit (HOSPITAL_COMMUNITY): Payer: Self-pay

## 2024-04-13 ENCOUNTER — Other Ambulatory Visit (HOSPITAL_BASED_OUTPATIENT_CLINIC_OR_DEPARTMENT_OTHER): Payer: Self-pay

## 2024-07-19 ENCOUNTER — Emergency Department (HOSPITAL_BASED_OUTPATIENT_CLINIC_OR_DEPARTMENT_OTHER)
Admission: EM | Admit: 2024-07-19 | Discharge: 2024-07-19 | Disposition: A | Discharge status: Home / Self Care | Payer: Self-pay | Attending: Emergency Medicine | Admitting: Emergency Medicine

## 2024-07-19 ENCOUNTER — Encounter (HOSPITAL_BASED_OUTPATIENT_CLINIC_OR_DEPARTMENT_OTHER): Payer: Self-pay

## 2024-07-19 ENCOUNTER — Other Ambulatory Visit: Payer: Self-pay

## 2024-07-19 DIAGNOSIS — L03211 Cellulitis of face: Secondary | ICD-10-CM

## 2024-07-19 MED ORDER — SODIUM CHLORIDE 0.9 % IV SOLN
2.0000 g | Freq: Once | INTRAVENOUS | Status: AC
  Administered 2024-07-19: 2 g via INTRAVENOUS
  Filled 2024-07-19: qty 20

## 2024-07-19 MED ORDER — HYDROCODONE-ACETAMINOPHEN 10-325 MG PO TABS: 1.0000 | ORAL_TABLET | Freq: Four times a day (QID) | ORAL | 0 refills | Status: AC | PRN

## 2024-07-19 MED ORDER — LIDOCAINE HCL 2 % IJ SOLN
10.0000 mL | Freq: Once | INTRAMUSCULAR | Status: AC
  Administered 2024-07-19: 200 mg
  Filled 2024-07-19: qty 20

## 2024-07-19 MED ORDER — HYDROCODONE-ACETAMINOPHEN 5-325 MG PO TABS
2.0000 | ORAL_TABLET | Freq: Once | ORAL | Status: DC
  Filled 2024-07-19: qty 2

## 2024-07-19 MED ORDER — KETOROLAC TROMETHAMINE 30 MG/ML IJ SOLN
15.0000 mg | Freq: Once | INTRAMUSCULAR | Status: AC
  Administered 2024-07-19: 15 mg via INTRAVENOUS
  Filled 2024-07-19: qty 1

## 2024-07-19 MED ORDER — DOXYCYCLINE HYCLATE 100 MG PO CAPS: 100.0000 mg | ORAL_CAPSULE | Freq: Two times a day (BID) | ORAL | 0 refills | Status: AC

## 2024-07-19 NOTE — ED Provider Notes (Signed)
 [place] [facility] Provider Note   CSN: [id number] Arrival date & time: 07/19/24  9047     Patient presents with: Facial Swelling   Roger Wright is a 43 y.o. male.   Patient complains of swelling to the left side of his face.  He reports the swelling began with a small pimple and has progressively gotten worse.  Patient states he has not had any dental problems he denies any fever or chills.  Patient has not had any previous history of skin infections.  Patient has a past medical history of hypertension and hepatitis.  The history is provided by the patient. No language interpreter was used.       Prior to Admission medications   Medication Sig Start Date End Date Taking? Authorizing Provider  doxycycline  (VIBRAMYCIN ) 100 MG capsule Take 1 capsule (100 mg total) by mouth 2 (two) times daily. 07/19/24  Yes Meriah Shands K, PA-C  HYDROcodone -acetaminophen  (NORCO) 10-325 MG tablet Take 1 tablet by mouth every 6 (six) hours as needed. 07/19/24  Yes Flint Raring K, PA-C  benazepril  (LOTENSIN ) 10 MG tablet Take 1 tablet (10 mg total) by mouth daily. 04/10/24   Silver Wonda LABOR, PA  cefadroxil  (DURICEF) 500 MG capsule Take 1 capsule (500 mg total) by mouth 2 (two) times daily. 04/10/24   Silver Wonda A, PA  celecoxib  (CELEBREX ) 200 MG capsule Take 1 capsule (200 mg total) by mouth 2 (two) times daily as needed. 04/10/24   Silver Wonda LABOR, PA    Allergies: Norvasc  [amlodipine ]    Review of Systems  HENT:  Positive for facial swelling.   All other systems reviewed and are negative.   Updated Vital Signs BP (!) 178/120 (BP Location: Right Arm)   Pulse (!) 118   Temp 99.5 F (37.5 C) (Oral)   Resp 18   Wt 74.8 kg   SpO2 100%   BMI 24.37 kg/m   Physical Exam Vitals and nursing note reviewed.  Constitutional:      Appearance: He is well-developed.  HENT:     Head: Normocephalic.     Comments: Swollen area left side of face above the  jawline below left cheek. Cardiovascular:     Rate and Rhythm: Normal rate.  Pulmonary:     Effort: Pulmonary effort is normal.  Abdominal:     General: There is no distension.  Musculoskeletal:        General: Normal range of motion.     Cervical back: Normal range of motion.  Skin:    General: Skin is warm.  Neurological:     General: No focal deficit present.     Mental Status: He is alert and oriented to person, place, and time.     (all labs ordered are listed, but only abnormal results are displayed) Labs Reviewed - No data to display  EKG: None  Radiology: No results found.   .Incision and Drainage  Date/Time: 07/19/2024 6:39 PM  Performed by: Flint Raring POUR, PA-C Authorized by: Flint Raring POUR, PA-C   Consent:    Consent obtained:  Verbal   Consent given by:  Patient   Risks, benefits, and alternatives were discussed: yes     Risks discussed:  Bleeding Universal protocol:    Procedure explained and questions answered to patient or proxy's satisfaction: yes     Immediately prior to procedure, a time out was called: yes     Patient identity confirmed:  Verbally with patient Location:  Type:  Abscess   Size:  3 cm   Location:  Head   Head location:  Face Pre-procedure details:    Skin preparation:  Povidone-iodine Anesthesia:    Anesthesia method:  Local infiltration   Local anesthetic:  Lidocaine  1% w/o epi Procedure type:    Complexity:  Complex Procedure details:    Ultrasound guidance: yes     Incision types:  Stab incision   Wound management:  Probed and deloculated   Wound treatment:  Wound left open   Packing materials:  None Post-procedure details:    Procedure completion:  Tolerated    Medications Ordered in the ED  lidocaine  (XYLOCAINE ) 2 % (with pres) injection 200 mg (200 mg Infiltration Given by Other 07/19/24 1105)  cefTRIAXone  (ROCEPHIN ) 2 g in sodium chloride  0.9 % 100 mL IVPB (0 g Intravenous Stopped 07/19/24 1135)  ketorolac   (TORADOL ) 30 MG/ML injection 15 mg (15 mg Intravenous Given 07/19/24 1200)                                    Medical Decision Making Patient complains of swelling to the left side of his face.  He states swelling began with a pimple.  Risk Prescription drug management. Risk Details: I was able to see an area of fluid on ultrasound.  I discussed options with patient.  Patient is amenable to I&D.  Patient is given Rocephin  IV.  He is given a prescription for doxycycline .  Patient is advised that he needs to return to the emergency department if symptoms worsen or change.  I was able to drain approximately 3 cc of purulent drainage        Final diagnoses:  Facial cellulitis    ED Discharge Orders          Ordered    doxycycline  (VIBRAMYCIN ) 100 MG capsule  2 times daily        07/19/24 1137    HYDROcodone -acetaminophen  (NORCO) 10-325 MG tablet  Every 6 hours PRN        07/19/24 1137           An After Visit Summary was printed and given to the patient.     Flint Sonny POUR, PA-C 07/19/24 1839    Elnor Jayson LABOR, DO 07/24/24 [phone]

## 2024-07-19 NOTE — ED Triage Notes (Signed)
 Left facial swelling x 2 days. Swelling increase and area is tight. Thought initially an ingrown hair and attempted to pull hair

## 2024-07-19 NOTE — Discharge Instructions (Addendum)
 Return if any problems.
